# Patient Record
Sex: Male | Born: 1955 | Race: White | Hispanic: No | Marital: Married | Smoking: Never smoker
Health system: Southern US, Community
[De-identification: ages and names within clinical notes are randomized; demographics above are authoritative.]

## PROBLEM LIST (undated history)

## (undated) DIAGNOSIS — I10 Essential (primary) hypertension: Secondary | ICD-10-CM

## (undated) DIAGNOSIS — C801 Malignant (primary) neoplasm, unspecified: Secondary | ICD-10-CM

## (undated) DIAGNOSIS — Z860100 Personal history of colon polyps, unspecified: Secondary | ICD-10-CM

## (undated) DIAGNOSIS — L80 Vitiligo: Secondary | ICD-10-CM

## (undated) DIAGNOSIS — R413 Other amnesia: Principal | ICD-10-CM

## (undated) DIAGNOSIS — E785 Hyperlipidemia, unspecified: Secondary | ICD-10-CM

## (undated) HISTORY — DX: Other amnesia: R41.3

## (undated) HISTORY — PX: ORCHIECTOMY: SHX2116

## (undated) HISTORY — DX: Personal history of colonic polyps: Z86.010

## (undated) HISTORY — DX: Malignant (primary) neoplasm, unspecified: C80.1

## (undated) HISTORY — PX: BICEPS TENDON REPAIR: SHX566

## (undated) HISTORY — DX: Essential (primary) hypertension: I10

## (undated) HISTORY — PX: UMBILICAL HERNIA REPAIR: SHX196

## (undated) HISTORY — DX: Personal history of colon polyps, unspecified: Z86.0100

## (undated) HISTORY — DX: Vitiligo: L80

## (undated) HISTORY — PX: TONSILLECTOMY: SUR1361

## (undated) HISTORY — DX: Hyperlipidemia, unspecified: E78.5

---

## 2000-05-11 ENCOUNTER — Encounter: Payer: Self-pay | Admitting: Family Medicine

## 2000-05-11 ENCOUNTER — Ambulatory Visit: Admission: RE | Admit: 2000-05-11 | Discharge: 2000-05-11 | Payer: Self-pay | Admitting: Family Medicine

## 2000-05-12 ENCOUNTER — Ambulatory Visit (HOSPITAL_COMMUNITY): Admission: RE | Admit: 2000-05-12 | Discharge: 2000-05-12 | Payer: Self-pay | Admitting: Urology

## 2000-05-12 ENCOUNTER — Encounter (INDEPENDENT_AMBULATORY_CARE_PROVIDER_SITE_OTHER): Payer: Self-pay

## 2000-05-14 ENCOUNTER — Encounter: Payer: Self-pay | Admitting: Urology

## 2000-05-14 ENCOUNTER — Encounter: Admission: RE | Admit: 2000-05-14 | Discharge: 2000-05-14 | Payer: Self-pay | Admitting: Urology

## 2000-06-12 ENCOUNTER — Ambulatory Visit (HOSPITAL_COMMUNITY): Admission: RE | Admit: 2000-06-12 | Discharge: 2000-06-12 | Payer: Self-pay | Admitting: Hematology & Oncology

## 2000-06-13 ENCOUNTER — Encounter: Payer: Self-pay | Admitting: Hematology & Oncology

## 2000-07-04 ENCOUNTER — Encounter: Payer: Self-pay | Admitting: Hematology & Oncology

## 2000-07-04 ENCOUNTER — Ambulatory Visit (HOSPITAL_COMMUNITY): Admission: RE | Admit: 2000-07-04 | Discharge: 2000-07-04 | Payer: Self-pay | Admitting: Hematology & Oncology

## 2000-07-08 ENCOUNTER — Ambulatory Visit (HOSPITAL_COMMUNITY): Admission: RE | Admit: 2000-07-08 | Discharge: 2000-07-08 | Payer: Self-pay | Admitting: Hematology & Oncology

## 2000-07-08 ENCOUNTER — Encounter: Payer: Self-pay | Admitting: Hematology & Oncology

## 2000-07-25 ENCOUNTER — Ambulatory Visit (HOSPITAL_COMMUNITY): Admission: RE | Admit: 2000-07-25 | Discharge: 2000-07-25 | Payer: Self-pay | Admitting: General Surgery

## 2000-07-28 ENCOUNTER — Encounter: Payer: Self-pay | Admitting: Hematology & Oncology

## 2000-07-28 ENCOUNTER — Encounter (HOSPITAL_COMMUNITY): Admission: RE | Admit: 2000-07-28 | Discharge: 2000-10-26 | Payer: Self-pay | Admitting: Hematology & Oncology

## 2000-08-05 ENCOUNTER — Encounter: Payer: Self-pay | Admitting: Hematology & Oncology

## 2000-08-05 ENCOUNTER — Ambulatory Visit (HOSPITAL_COMMUNITY): Admission: RE | Admit: 2000-08-05 | Discharge: 2000-08-05 | Payer: Self-pay | Admitting: Hematology & Oncology

## 2000-08-07 ENCOUNTER — Ambulatory Visit (HOSPITAL_COMMUNITY): Admission: RE | Admit: 2000-08-07 | Discharge: 2000-08-07 | Payer: Self-pay | Admitting: Hematology & Oncology

## 2000-08-07 ENCOUNTER — Encounter: Payer: Self-pay | Admitting: Hematology & Oncology

## 2000-08-18 ENCOUNTER — Encounter: Admission: RE | Admit: 2000-08-18 | Discharge: 2000-08-18 | Payer: Self-pay | Admitting: Hematology & Oncology

## 2000-08-18 ENCOUNTER — Encounter: Payer: Self-pay | Admitting: Hematology & Oncology

## 2000-09-05 ENCOUNTER — Encounter: Payer: Self-pay | Admitting: Hematology & Oncology

## 2000-09-05 ENCOUNTER — Ambulatory Visit (HOSPITAL_COMMUNITY): Admission: RE | Admit: 2000-09-05 | Discharge: 2000-09-05 | Payer: Self-pay | Admitting: Hematology & Oncology

## 2000-09-17 ENCOUNTER — Encounter (INDEPENDENT_AMBULATORY_CARE_PROVIDER_SITE_OTHER): Payer: Self-pay | Admitting: Specialist

## 2000-09-17 ENCOUNTER — Ambulatory Visit (HOSPITAL_COMMUNITY): Admission: RE | Admit: 2000-09-17 | Discharge: 2000-09-17 | Payer: Self-pay | Admitting: Gastroenterology

## 2004-09-28 ENCOUNTER — Ambulatory Visit: Payer: Self-pay | Admitting: Hematology & Oncology

## 2004-10-04 ENCOUNTER — Ambulatory Visit: Payer: Self-pay | Admitting: Family Medicine

## 2004-10-09 ENCOUNTER — Ambulatory Visit: Payer: Self-pay | Admitting: Family Medicine

## 2004-10-30 ENCOUNTER — Ambulatory Visit: Payer: Self-pay | Admitting: Family Medicine

## 2004-12-31 ENCOUNTER — Ambulatory Visit: Payer: Self-pay | Admitting: Family Medicine

## 2005-01-07 ENCOUNTER — Ambulatory Visit: Payer: Self-pay | Admitting: Family Medicine

## 2005-04-04 ENCOUNTER — Ambulatory Visit: Payer: Self-pay | Admitting: Hematology & Oncology

## 2005-08-16 ENCOUNTER — Ambulatory Visit: Payer: Self-pay | Admitting: Family Medicine

## 2005-09-18 ENCOUNTER — Ambulatory Visit: Payer: Self-pay | Admitting: Family Medicine

## 2005-10-03 ENCOUNTER — Ambulatory Visit: Payer: Self-pay | Admitting: Hematology & Oncology

## 2005-10-29 ENCOUNTER — Ambulatory Visit: Payer: Self-pay | Admitting: Family Medicine

## 2005-10-31 ENCOUNTER — Ambulatory Visit: Payer: Self-pay | Admitting: Internal Medicine

## 2005-11-21 ENCOUNTER — Ambulatory Visit: Payer: Self-pay | Admitting: Family Medicine

## 2006-01-27 ENCOUNTER — Ambulatory Visit: Payer: Self-pay | Admitting: Family Medicine

## 2006-02-27 ENCOUNTER — Ambulatory Visit: Payer: Self-pay | Admitting: Family Medicine

## 2006-03-28 ENCOUNTER — Ambulatory Visit: Payer: Self-pay | Admitting: Hematology & Oncology

## 2006-03-28 ENCOUNTER — Ambulatory Visit: Payer: Self-pay | Admitting: Family Medicine

## 2006-04-01 LAB — CBC WITH DIFFERENTIAL/PLATELET
Basophils Absolute: 0.1 10*3/uL (ref 0.0–0.1)
Eosinophils Absolute: 0.1 10*3/uL (ref 0.0–0.5)
HGB: 15.1 g/dL (ref 13.0–17.1)
LYMPH%: 24.7 % (ref 14.0–48.0)
MCV: 88.9 fL (ref 81.6–98.0)
MONO%: 6.7 % (ref 0.0–13.0)
NEUT#: 6.9 10*3/uL — ABNORMAL HIGH (ref 1.5–6.5)
NEUT%: 66.9 % (ref 40.0–75.0)
Platelets: 288 10*3/uL (ref 145–400)

## 2006-04-03 LAB — COMPREHENSIVE METABOLIC PANEL WITH GFR
ALT: 34 U/L (ref 0–40)
AST: 20 U/L (ref 0–37)
Albumin: 4 g/dL (ref 3.5–5.2)
Alkaline Phosphatase: 29 U/L — ABNORMAL LOW (ref 39–117)
BUN: 19 mg/dL (ref 6–23)
CO2: 28 meq/L (ref 19–32)
Calcium: 9.3 mg/dL (ref 8.4–10.5)
Chloride: 103 meq/L (ref 96–112)
Creatinine, Ser: 1 mg/dL (ref 0.4–1.5)
Glucose, Bld: 104 mg/dL — ABNORMAL HIGH (ref 70–99)
Potassium: 3.3 meq/L — ABNORMAL LOW (ref 3.5–5.3)
Sodium: 140 meq/L (ref 135–145)
Total Bilirubin: 0.6 mg/dL (ref 0.3–1.2)
Total Protein: 6.6 g/dL (ref 6.0–8.3)

## 2006-04-03 LAB — LACTATE DEHYDROGENASE: LDH: 153 U/L (ref 94–250)

## 2006-04-03 LAB — BETA HCG QUANT (REF LAB): Beta hCG, Tumor Marker: <1 IU/L (ref 0–3)

## 2006-06-16 ENCOUNTER — Ambulatory Visit: Payer: Self-pay | Admitting: Family Medicine

## 2006-07-14 ENCOUNTER — Encounter: Admission: RE | Admit: 2006-07-14 | Discharge: 2006-07-14 | Payer: Self-pay | Admitting: Family Medicine

## 2006-09-25 ENCOUNTER — Ambulatory Visit: Payer: Self-pay | Admitting: Hematology & Oncology

## 2006-09-29 LAB — CBC WITH DIFFERENTIAL/PLATELET
BASO%: 0.3 % (ref 0.0–2.0)
EOS%: 4.5 % (ref 0.0–7.0)
Eosinophils Absolute: 0.3 10*3/uL (ref 0.0–0.5)
HCT: 44.6 % (ref 38.7–49.9)
HGB: 15.6 g/dL (ref 13.0–17.1)
MCH: 31.3 pg (ref 28.0–33.4)
MCHC: 35 g/dL (ref 32.0–35.9)
NEUT%: 61.3 % (ref 40.0–75.0)
RBC: 4.99 10*6/uL (ref 4.20–5.71)
RDW: 13.8 % (ref 11.2–14.6)
WBC: 6.9 10*3/uL (ref 4.0–10.0)
lymph#: 1.7 10*3/uL (ref 0.9–3.3)

## 2006-10-01 LAB — COMPREHENSIVE METABOLIC PANEL
ALT: 51 U/L (ref 0–53)
AST: 25 U/L (ref 0–37)
Calcium: 9.4 mg/dL (ref 8.4–10.5)
Chloride: 101 mEq/L (ref 96–112)
Creatinine, Ser: 1.12 mg/dL (ref 0.40–1.50)
Sodium: 139 mEq/L (ref 135–145)
Total Bilirubin: 0.7 mg/dL (ref 0.3–1.2)
Total Protein: 6.8 g/dL (ref 6.0–8.3)

## 2007-02-05 ENCOUNTER — Ambulatory Visit: Payer: Self-pay | Admitting: Family Medicine

## 2007-08-19 ENCOUNTER — Ambulatory Visit: Payer: Self-pay | Admitting: Internal Medicine

## 2007-10-04 ENCOUNTER — Ambulatory Visit: Payer: Self-pay | Admitting: Hematology & Oncology

## 2007-10-07 LAB — CBC WITH DIFFERENTIAL/PLATELET
BASO%: 0.3 % (ref 0.0–2.0)
HCT: 44.6 % (ref 38.7–49.9)
LYMPH%: 21.5 % (ref 14.0–48.0)
MCH: 31.9 pg (ref 28.0–33.4)
MCHC: 36.1 g/dL — ABNORMAL HIGH (ref 32.0–35.9)
MCV: 88.3 fL (ref 81.6–98.0)
MONO#: 0.5 10*3/uL (ref 0.1–0.9)
MONO%: 7 % (ref 0.0–13.0)
NEUT%: 67.5 % (ref 40.0–75.0)
Platelets: 218 10*3/uL (ref 145–400)
RBC: 5.05 10*6/uL (ref 4.20–5.71)
WBC: 7.5 10*3/uL (ref 4.0–10.0)

## 2007-10-09 LAB — COMPREHENSIVE METABOLIC PANEL
ALT: 37 U/L (ref 0–53)
Alkaline Phosphatase: 33 U/L — ABNORMAL LOW (ref 39–117)
CO2: 25 mEq/L (ref 19–32)
Creatinine, Ser: 1.11 mg/dL (ref 0.40–1.50)
Total Bilirubin: 0.5 mg/dL (ref 0.3–1.2)

## 2007-10-09 LAB — LACTATE DEHYDROGENASE: LDH: 173 U/L (ref 94–250)

## 2007-10-09 LAB — AFP TUMOR MARKER: AFP-Tumor Marker: 27.2 ng/mL — ABNORMAL HIGH (ref 0.0–8.0)

## 2007-10-19 ENCOUNTER — Ambulatory Visit: Payer: Self-pay | Admitting: Internal Medicine

## 2007-10-22 ENCOUNTER — Ambulatory Visit: Payer: Self-pay | Admitting: Internal Medicine

## 2007-10-22 DIAGNOSIS — E785 Hyperlipidemia, unspecified: Secondary | ICD-10-CM | POA: Insufficient documentation

## 2007-10-22 DIAGNOSIS — R7309 Other abnormal glucose: Secondary | ICD-10-CM

## 2007-10-22 DIAGNOSIS — I10 Essential (primary) hypertension: Secondary | ICD-10-CM

## 2007-10-22 DIAGNOSIS — Z860100 Personal history of colon polyps, unspecified: Secondary | ICD-10-CM | POA: Insufficient documentation

## 2007-10-22 DIAGNOSIS — C6290 Malignant neoplasm of unspecified testis, unspecified whether descended or undescended: Secondary | ICD-10-CM

## 2007-10-23 LAB — CONVERTED CEMR LAB
Basophils Relative: 0.7 % (ref 0.0–1.0)
Bilirubin, Direct: 0.1 mg/dL (ref 0.0–0.3)
CO2: 31 meq/L (ref 19–32)
Eosinophils Absolute: 0.2 10*3/uL (ref 0.0–0.6)
Eosinophils Relative: 2.7 % (ref 0.0–5.0)
GFR calc Af Amer: 91 mL/min
GFR calc non Af Amer: 75 mL/min
Glucose, Bld: 115 mg/dL — ABNORMAL HIGH (ref 70–99)
HCT: 45.1 % (ref 39.0–52.0)
Hemoglobin: 15.8 g/dL (ref 13.0–17.0)
Leukocytes, UA: NEGATIVE
Lymphocytes Relative: 21.4 % (ref 12.0–46.0)
MCV: 89.7 fL (ref 78.0–100.0)
Monocytes Absolute: 0.7 10*3/uL (ref 0.2–0.7)
Neutro Abs: 4.9 10*3/uL (ref 1.4–7.7)
Neutrophils Relative %: 66.4 % (ref 43.0–77.0)
Nitrite: NEGATIVE
PSA: 0.57 ng/mL (ref 0.10–4.00)
Sodium: 142 meq/L (ref 135–145)
TSH: 1.57 microintl units/mL (ref 0.35–5.50)
Total Protein: 7 g/dL (ref 6.0–8.3)
Triglycerides: 352 mg/dL (ref 0–149)
Urine Glucose: NEGATIVE mg/dL
WBC: 7.5 10*3/uL (ref 4.5–10.5)

## 2007-10-28 ENCOUNTER — Encounter: Payer: Self-pay | Admitting: Internal Medicine

## 2007-10-28 LAB — CONVERTED CEMR LAB: Vitamin B-12: 355 pg/mL (ref 211–911)

## 2007-11-03 ENCOUNTER — Telehealth: Payer: Self-pay | Admitting: Internal Medicine

## 2007-12-04 ENCOUNTER — Telehealth: Payer: Self-pay | Admitting: Internal Medicine

## 2007-12-07 ENCOUNTER — Telehealth (INDEPENDENT_AMBULATORY_CARE_PROVIDER_SITE_OTHER): Payer: Self-pay | Admitting: *Deleted

## 2007-12-22 ENCOUNTER — Ambulatory Visit: Payer: Self-pay | Admitting: Internal Medicine

## 2007-12-22 LAB — CONVERTED CEMR LAB
AST: 27 units/L (ref 0–37)
CRP, High Sensitivity: 3 (ref 0.00–5.00)
Cholesterol: 182 mg/dL (ref 0–200)
Total CHOL/HDL Ratio: 5.4
Triglycerides: 320 mg/dL (ref 0–149)
VLDL: 64 mg/dL — ABNORMAL HIGH (ref 0–40)

## 2007-12-25 ENCOUNTER — Ambulatory Visit: Payer: Self-pay | Admitting: Internal Medicine

## 2007-12-25 ENCOUNTER — Telehealth (INDEPENDENT_AMBULATORY_CARE_PROVIDER_SITE_OTHER): Payer: Self-pay | Admitting: *Deleted

## 2008-05-05 ENCOUNTER — Encounter: Payer: Self-pay | Admitting: Internal Medicine

## 2008-06-22 ENCOUNTER — Ambulatory Visit: Payer: Self-pay | Admitting: Family Medicine

## 2008-06-22 ENCOUNTER — Emergency Department (HOSPITAL_COMMUNITY): Admission: EM | Admit: 2008-06-22 | Discharge: 2008-06-22 | Payer: Self-pay | Admitting: Emergency Medicine

## 2008-09-27 ENCOUNTER — Ambulatory Visit: Payer: Self-pay | Admitting: Hematology & Oncology

## 2008-09-28 LAB — CBC WITH DIFFERENTIAL (CANCER CENTER ONLY)
BASO#: 0.1 10*3/uL (ref 0.0–0.2)
EOS%: 4 % (ref 0.0–7.0)
HCT: 45 % (ref 38.7–49.9)
HGB: 15.8 g/dL (ref 13.0–17.1)
LYMPH#: 2.1 10*3/uL (ref 0.9–3.3)
MCH: 31.5 pg (ref 28.0–33.4)
MCHC: 35.2 g/dL (ref 32.0–35.9)
NEUT%: 59.8 % (ref 40.0–80.0)

## 2008-09-29 LAB — TESTOSTERONE: Testosterone: 227.88 ng/dL — ABNORMAL LOW (ref 350–890)

## 2008-10-02 LAB — COMPREHENSIVE METABOLIC PANEL
ALT: 21 U/L (ref 0–53)
BUN: 16 mg/dL (ref 6–23)
CO2: 29 mEq/L (ref 19–32)
Calcium: 9.8 mg/dL (ref 8.4–10.5)
Chloride: 100 mEq/L (ref 96–112)
Creatinine, Ser: 1.2 mg/dL (ref 0.40–1.50)
Glucose, Bld: 88 mg/dL (ref 70–99)
Total Bilirubin: 0.5 mg/dL (ref 0.3–1.2)

## 2008-10-02 LAB — BETA HCG QUANT (REF LAB): Beta hCG, Tumor Marker: 0.5 m[IU]/mL (ref <5.0)

## 2008-10-02 LAB — PSA: PSA: 0.59 ng/mL (ref 0.10–4.00)

## 2008-10-02 LAB — AFP TUMOR MARKER: AFP-Tumor Marker: 26.9 ng/mL — ABNORMAL HIGH (ref 0.0–8.0)

## 2008-11-24 ENCOUNTER — Telehealth: Payer: Self-pay | Admitting: *Deleted

## 2008-11-25 ENCOUNTER — Telehealth: Payer: Self-pay | Admitting: *Deleted

## 2008-12-13 ENCOUNTER — Ambulatory Visit: Payer: Self-pay | Admitting: Family Medicine

## 2008-12-13 LAB — CONVERTED CEMR LAB
AST: 25 units/L (ref 0–37)
Albumin: 3.8 g/dL (ref 3.5–5.2)
Alkaline Phosphatase: 28 units/L — ABNORMAL LOW (ref 39–117)
BUN: 17 mg/dL (ref 6–23)
Bilirubin Urine: NEGATIVE
Bilirubin, Direct: 0.1 mg/dL (ref 0.0–0.3)
CO2: 27 meq/L (ref 19–32)
Chloride: 102 meq/L (ref 96–112)
Eosinophils Relative: 4.6 % (ref 0.0–5.0)
GFR calc Af Amer: 114 mL/min
Glucose, Bld: 108 mg/dL — ABNORMAL HIGH (ref 70–99)
HCT: 43.8 % (ref 39.0–52.0)
HDL: 36 mg/dL — ABNORMAL LOW (ref >39.0)
Hemoglobin, Urine: NEGATIVE
Leukocytes, UA: NEGATIVE
Lymphocytes Relative: 21.9 % (ref 12.0–46.0)
Monocytes Absolute: 0.6 10*3/uL (ref 0.1–1.0)
Monocytes Relative: 9 % (ref 3.0–12.0)
Neutrophils Relative %: 63.9 % (ref 43.0–77.0)
Nitrite: NEGATIVE
Platelets: 176 10*3/uL (ref 150–400)
Potassium: 3.1 meq/L — ABNORMAL LOW (ref 3.5–5.1)
RDW: 12.5 % (ref 11.5–14.6)
Sodium: 138 meq/L (ref 135–145)
Testosterone: 261.1 ng/dL — ABNORMAL LOW (ref 350.00–890)
Total CHOL/HDL Ratio: 4.9
Total Protein, Urine: NEGATIVE mg/dL
Total Protein: 6.6 g/dL (ref 6.0–8.3)
Triglycerides: 270 mg/dL (ref 0–149)
WBC: 6.8 10*3/uL (ref 4.5–10.5)

## 2008-12-20 ENCOUNTER — Ambulatory Visit: Payer: Self-pay | Admitting: Family Medicine

## 2008-12-20 DIAGNOSIS — E349 Endocrine disorder, unspecified: Secondary | ICD-10-CM

## 2008-12-20 DIAGNOSIS — L80 Vitiligo: Secondary | ICD-10-CM

## 2008-12-22 ENCOUNTER — Telehealth: Payer: Self-pay | Admitting: Family Medicine

## 2009-01-05 ENCOUNTER — Ambulatory Visit: Payer: Self-pay | Admitting: Family Medicine

## 2009-03-24 ENCOUNTER — Ambulatory Visit (HOSPITAL_BASED_OUTPATIENT_CLINIC_OR_DEPARTMENT_OTHER): Admission: RE | Admit: 2009-03-24 | Discharge: 2009-03-24 | Payer: Self-pay | Admitting: General Surgery

## 2009-04-06 ENCOUNTER — Ambulatory Visit: Payer: Self-pay | Admitting: Family Medicine

## 2009-04-13 LAB — CONVERTED CEMR LAB: Testosterone: 382.15 ng/dL (ref 350.00–890.00)

## 2009-08-17 ENCOUNTER — Telehealth: Payer: Self-pay | Admitting: Family Medicine

## 2009-09-26 ENCOUNTER — Ambulatory Visit: Payer: Self-pay | Admitting: Hematology & Oncology

## 2009-09-27 LAB — CBC WITH DIFFERENTIAL (CANCER CENTER ONLY)
Eosinophils Absolute: 0.3 10*3/uL (ref 0.0–0.5)
HGB: 15.8 g/dL (ref 13.0–17.1)
MCV: 88 fL (ref 82–98)
MONO#: 0.4 10*3/uL (ref 0.1–0.9)
NEUT#: 5.8 10*3/uL (ref 1.5–6.5)
Platelets: 180 10*3/uL (ref 145–400)
RBC: 5.12 10*6/uL (ref 4.20–5.70)
WBC: 8.2 10*3/uL (ref 4.0–10.0)

## 2009-09-28 LAB — LIPID PANEL
Cholesterol: 202 mg/dL — ABNORMAL HIGH (ref 0–200)
HDL: 45 mg/dL (ref >39)
Total CHOL/HDL Ratio: 4.5 Ratio
Triglycerides: 420 mg/dL — ABNORMAL HIGH (ref <150)

## 2009-09-28 LAB — TESTOSTERONE: Testosterone: 174.27 ng/dL — ABNORMAL LOW (ref 350–890)

## 2009-09-29 LAB — BETA HCG QUANT (REF LAB): Beta hCG, Tumor Marker: 2.8 m[IU]/mL (ref <5.0)

## 2009-09-29 LAB — COMPREHENSIVE METABOLIC PANEL
ALT: 21 U/L (ref 0–53)
AST: 19 U/L (ref 0–37)
CO2: 27 mEq/L (ref 19–32)
Chloride: 101 mEq/L (ref 96–112)
Creatinine, Ser: 1.11 mg/dL (ref 0.40–1.50)
Sodium: 139 mEq/L (ref 135–145)
Total Bilirubin: 0.4 mg/dL (ref 0.3–1.2)
Total Protein: 7.2 g/dL (ref 6.0–8.3)

## 2009-09-29 LAB — PSA: PSA: 0.44 ng/mL (ref 0.10–4.00)

## 2009-09-29 LAB — LACTATE DEHYDROGENASE: LDH: 144 U/L (ref 94–250)

## 2009-09-29 LAB — AFP TUMOR MARKER: AFP-Tumor Marker: 27.8 ng/mL — ABNORMAL HIGH (ref 0.0–8.0)

## 2009-10-11 ENCOUNTER — Telehealth: Payer: Self-pay | Admitting: Family Medicine

## 2009-10-20 ENCOUNTER — Telehealth: Payer: Self-pay | Admitting: Family Medicine

## 2009-10-23 ENCOUNTER — Telehealth: Payer: Self-pay | Admitting: Family Medicine

## 2009-12-18 ENCOUNTER — Telehealth (INDEPENDENT_AMBULATORY_CARE_PROVIDER_SITE_OTHER): Payer: Self-pay | Admitting: *Deleted

## 2009-12-18 ENCOUNTER — Ambulatory Visit: Payer: Self-pay | Admitting: Family Medicine

## 2009-12-18 LAB — CONVERTED CEMR LAB
AST: 30 units/L (ref 0–37)
Albumin: 4 g/dL (ref 3.5–5.2)
Alkaline Phosphatase: 33 units/L — ABNORMAL LOW (ref 39–117)
Basophils Relative: 0.7 % (ref 0.0–3.0)
CO2: 29 meq/L (ref 19–32)
Calcium: 9.3 mg/dL (ref 8.4–10.5)
Direct LDL: 93.5 mg/dL
GFR calc non Af Amer: 74.63 mL/min (ref >60)
HCT: 47.6 % (ref 39.0–52.0)
Hemoglobin: 16.1 g/dL (ref 13.0–17.0)
Lymphocytes Relative: 20.4 % (ref 12.0–46.0)
Lymphs Abs: 1.8 10*3/uL (ref 0.7–4.0)
MCHC: 33.7 g/dL (ref 30.0–36.0)
Monocytes Relative: 9 % (ref 3.0–12.0)
Neutro Abs: 5.4 10*3/uL (ref 1.4–7.7)
Nitrite: NEGATIVE
PSA: 0.65 ng/mL (ref 0.10–4.00)
Potassium: 3.3 meq/L — ABNORMAL LOW (ref 3.5–5.1)
RBC: 5.15 M/uL (ref 4.22–5.81)
Sodium: 141 meq/L (ref 135–145)
Specific Gravity, Urine: 1.025 (ref 1.000–1.030)
Total Protein, Urine: NEGATIVE mg/dL
Total Protein: 7 g/dL (ref 6.0–8.3)
Vit D, 25-Hydroxy: 41 ng/mL (ref 30–89)
pH: 6 (ref 5.0–8.0)

## 2009-12-25 ENCOUNTER — Ambulatory Visit: Payer: Self-pay | Admitting: Family Medicine

## 2010-02-06 ENCOUNTER — Ambulatory Visit: Payer: Self-pay | Admitting: Family Medicine

## 2010-02-06 DIAGNOSIS — J301 Allergic rhinitis due to pollen: Secondary | ICD-10-CM

## 2010-09-21 ENCOUNTER — Ambulatory Visit: Payer: Self-pay | Admitting: Hematology & Oncology

## 2010-09-26 ENCOUNTER — Encounter: Payer: Self-pay | Admitting: Family Medicine

## 2010-09-26 LAB — CBC WITH DIFFERENTIAL (CANCER CENTER ONLY)
BASO#: 0.1 10e3/uL (ref 0.0–0.2)
BASO%: 1.5 % (ref 0.0–2.0)
EOS%: 4.6 % (ref 0.0–7.0)
Eosinophils Absolute: 0.3 10e3/uL (ref 0.0–0.5)
HCT: 48.5 % (ref 38.7–49.9)
HGB: 16.5 g/dL (ref 13.0–17.1)
LYMPH#: 1.5 10e3/uL (ref 0.9–3.3)
LYMPH%: 20.3 % (ref 14.0–48.0)
MCH: 30.4 pg (ref 28.0–33.4)
MCHC: 33.9 g/dL (ref 32.0–35.9)
MCV: 89 fL (ref 82–98)
MONO#: 0.7 10e3/uL (ref 0.1–0.9)
MONO%: 9.7 % (ref 0.0–13.0)
NEUT#: 4.8 10e3/uL (ref 1.5–6.5)
NEUT%: 63.9 % (ref 40.0–80.0)
Platelets: 225 10e3/uL (ref 145–400)
RBC: 5.42 10e6/uL (ref 4.20–5.70)
RDW: 12.1 % (ref 10.5–14.6)
WBC: 7.5 10e3/uL (ref 4.0–10.0)

## 2010-10-02 LAB — COMPREHENSIVE METABOLIC PANEL
ALT: 44 U/L (ref 0–53)
Alkaline Phosphatase: 33 U/L — ABNORMAL LOW (ref 39–117)
CO2: 26 mEq/L (ref 19–32)
Sodium: 139 mEq/L (ref 135–145)
Total Bilirubin: 0.4 mg/dL (ref 0.3–1.2)
Total Protein: 7 g/dL (ref 6.0–8.3)

## 2010-10-02 LAB — VITAMIN D 25 HYDROXY (VIT D DEFICIENCY, FRACTURES): Vit D, 25-Hydroxy: 32 ng/mL (ref 30–89)

## 2010-10-02 LAB — LIPID PANEL
HDL: 33 mg/dL — ABNORMAL LOW (ref >39)
Total CHOL/HDL Ratio: 5.5 Ratio

## 2010-10-02 LAB — TESTOSTERONE, FREE & TOTAL: Testosterone, total: 387 ng/dL (ref 250–1100)

## 2010-10-15 ENCOUNTER — Telehealth: Payer: Self-pay | Admitting: Family Medicine

## 2010-10-25 ENCOUNTER — Ambulatory Visit: Payer: Self-pay | Admitting: Internal Medicine

## 2010-11-26 ENCOUNTER — Other Ambulatory Visit: Payer: Self-pay | Admitting: Family Medicine

## 2010-11-26 ENCOUNTER — Ambulatory Visit: Admission: RE | Admit: 2010-11-26 | Discharge: 2010-11-26 | Payer: Self-pay | Source: Home / Self Care

## 2010-11-26 LAB — HEPATIC FUNCTION PANEL
ALT: 47 U/L (ref 0–53)
AST: 35 U/L (ref 0–37)
Albumin: 3.7 g/dL (ref 3.5–5.2)
Alkaline Phosphatase: 30 U/L — ABNORMAL LOW (ref 39–117)
Bilirubin, Direct: 0.1 mg/dL (ref 0.0–0.3)
Total Bilirubin: 0.8 mg/dL (ref 0.3–1.2)
Total Protein: 6.6 g/dL (ref 6.0–8.3)

## 2010-11-26 LAB — LDL CHOLESTEROL, DIRECT: Direct LDL: 96.5 mg/dL

## 2010-11-26 LAB — LIPID PANEL
Cholesterol: 175 mg/dL (ref 0–200)
HDL: 34.9 mg/dL — ABNORMAL LOW (ref >39.00)
Total CHOL/HDL Ratio: 5
Triglycerides: 359 mg/dL — ABNORMAL HIGH (ref 0.0–149.0)
VLDL: 71.8 mg/dL — ABNORMAL HIGH (ref 0.0–40.0)

## 2010-11-26 LAB — HEMOGLOBIN A1C: Hgb A1c MFr Bld: 6.3 % (ref 4.6–6.5)

## 2010-11-29 ENCOUNTER — Ambulatory Visit: Admission: RE | Admit: 2010-11-29 | Discharge: 2010-11-29 | Payer: Self-pay | Source: Home / Self Care

## 2010-12-18 NOTE — Progress Notes (Signed)
Summary: REFILL  Phone Note Call from Patient Call back at Home Phone [phone]   Caller: PT LIVE Call For: tODD Summary of Call: HAS HIS CPX SET FOR 2-1 NEEDS REFILL ON ATNOLOL -CHLORTHA 50/25 TBMYL TAKE 1 PER DAY [phone] CELL [phone] Initial call taken by: Roselle Locus,  November 25, 2008 8:42 AM      Prescriptions: TENORETIC 50 50-25 MG  TABS (ATENOLOL-CHLORTHALIDONE) 1 once daily  #60 x 0   Entered by:   Kern Reap CMA   Authorized by:   Roderick Pee MD   Signed by:   Kern Reap CMA on 11/25/2008   Method used:   [facility]        [facility]  [place] [address] [facility]       [address]       [place], [place]  [address]       Ph: [phone] or [phone]       Fax: [phone]   RxID:   [id number]

## 2010-12-18 NOTE — Progress Notes (Signed)
Summary: vitamin d level  Phone Note Call from Patient Call back at Home Phone [phone]   Caller: Spouse Call For: fry Summary of Call: pts wife came in to see Dr Clent Ridges on Wednesday and she said that Dr Tawanna Cooler needed to look at pt's Vitamin D level.  Pt had labs done at Dr Gustavo Lah office and the vit D was low.  pt was wondering if a rx should be called in.  please advise Initial call taken by: Alfred Levins, CMA,  October 11, 2009 4:43 PM  Follow-up for Phone Call        the physician who orders the test is the one who will prescribe the treatment........... do not call wife.......... call husband directly Follow-up by: Roderick Pee MD,  October 16, 2009 8:44 AM  Additional Follow-up for Phone Call Additional follow up Details #1::        left message on machine at work number for patient to return our call Additional Follow-up by: Kern Reap CMA Duncan Dull),  October 16, 2009 5:49 PM    Additional Follow-up for Phone Call Additional follow up Details #2::    Pt's wife calls requesting all lab results be given to her.  Advised her that we will keep attempting to call her husband directly. [phone] Follow-up by: Lynann Beaver CMA,  October 20, 2009 11:06 AM  Additional Follow-up for Phone Call Additional follow up Details #3:: Details for Additional Follow-up Action Taken: left message on machine at work for patient to return our call. Additional Follow-up by: Kern Reap CMA (AAMA),  October 20, 2009 12:09 PM

## 2010-12-18 NOTE — Assessment & Plan Note (Signed)
Summary: new to lipid/dyslipidemia/jml   Visit Type:  Initial Consult   History of Present Illness:       This is a 55 year old male who presents for evaluation in Lipid Clinic.  The patient presents with history of hyperlipidemia with his most recetn lab tests showing TG>600.  He does not remember these being this elevated in the past, but after reviewing records, saw his TG were in the 400s last year.  He does not have a history of diabetes but has several fasting glucose levels >110.  Most recent was 119.  A1c was done 2 years ago and was 5.8%.  Pertinent family history includes history of hyperlipidemia with parent and siblings.   He is currently taking Crestor 10mg  daily for his cholesterol.  He started taking fish oil 2 gm after the last blood work was done.       When asked about diet, pt admitted that he was a "meat and potatoes" person and does not like fruits or vegetables.  He does not eat breakfast every day, but when he does it may be a bagel with cream cheese, instant oatmeal with splenda, or eggs and waffles on the weekend.  He does carry his lunch to work.  He usually packs a cold cut sandwich on whole wheat bread, apples, and cheese and crackers.  Dinner is his larger meal with steak, chicken, or pasta and occassionally fish.  The only vegetables he likes to eat are corn and carrots.  He tries to limit desserts as much as possible.  For snacks he likes cheese-its, nuts, and potato chips.  He does report drinking  ~2 beers/day.        Exercise habits reveals that the patient is not exercising on a routine basis.  [facility] and [facility] 30lbs.  He has a high stress job in IT and states he has just gotten out of the habit of going.    Lipid Management Provider  Weston Brass, PharmD  Preventive Screening-Counseling & Management  Alcohol-Tobacco     Alcohol drinks/day: 2     Alcohol type: beer     Smoking Status:  never  Current Medications (verified): 1)  Tenoretic 50 50-25 Mg  Tabs (Atenolol-Chlorthalidone) .Marland Kitchen.. 1 Once Daily 2)  Klor-Con M20 20 Meq  Tbcr (Potassium Chloride Crys Cr) .... 3 Qam 3)  Crestor 10 Mg  Tabs (Rosuvastatin Calcium) .... Take 1 Tab At Bedtime 4)  Aspir-Low 81 Mg Tbec (Aspirin) .... Once Daily 5)  Fish Oil 1000 Mg Caps (Omega-3 Fatty Acids) .... Once Daily 6)  Androgel Pump 1 % Gel (Testosterone) .... 2 Pumps Daily As Needed 7)  Fenofibrate 160 Mg Tabs (Fenofibrate) .... Take One Tablet By Mouth Daily With A Meal  Allergies (verified): No Known Drug Allergies  Past History:  Past Medical History: Last updated: 12/25/2007 Hyperlipidemia Hypertension Testicular cancer - followed by Dr. Myna Hidalgo Colonic polyps, hx of Vitiligo  Family History: Last updated: 10/22/2007 Family History High cholesterol Family History Hypertension denies history of premature coronary disease. Denies family history of diabetes and  Social History: Last updated: 12/25/2007 Married with two children ages 51, 60 Occupation: Ecologist  Never Smoked Alcohol use-yes (social)  Social History: Alcohol drinks/day:  2   Vital Signs:  Patient profile:   55 year old male Weight:      252 pounds Pulse rate:  75 / minute BP sitting:   132 / 84  Impression & Recommendations:  Problem # 1:  DYSLIPIDEMIA (ICD-272.4) Pt's cholesterol: TC- 181, TG- 617, HDL-35, and LDL not calculated due to elevated TG.  Pt currently on Crestor 10mg  and recently started fish oil.  Since he is tolerating these medications, will continue at this time.  Will add fenofibrate 160mg  once daily to help lower TG.  Pt instructed to call if any muscle pain or aches occur.  Discussed need to change diet and exercise habits with pt.  Will make small changes at a time in hopes pt will make lifelong changes rather than just change for a few months.  Asked him to decrease alcohol intake and to change to  healthier snacks such as walnuts and almonds.  Will try to start exercising again for 30 minutes 3 days a week.  Will f/u in 1 month.    His updated medication list for this problem includes:    Crestor 10 Mg Tabs (Rosuvastatin calcium) .Marland Kitchen... Take 1 tab at bedtime    Fenofibrate 160 Mg Tabs (Fenofibrate) .Marland Kitchen... Take one tablet by mouth daily with a meal  Problem # 2:  OTHER ABNORMAL GLUCOSE (ICD-790.29) Pt's most recent fasting glucose was 119.  Given elevated TGs and pt's self-reported diet, question if pre-diabetic or diabetic.  Will check A1c prior to next visit to confirm.   Patient Instructions: 1)  Start fenofibrate 160mg  once daily 2)  Continue all other medications 3)  Try to decrease alcohol intake to just on special occassions. 4)  Decrease snacks as much as possible; try to stay with walnuts and almonds and snacks 5)  Exercise goal: 30 minutes at least 3 days per week 6)  Labs: November 26, 2010 in am (fasting) at the [place] office 7)  Lipid Clinic Appt: November 29, 2010 at 8:30am  Prescriptions: FENOFIBRATE 160 MG TABS (FENOFIBRATE) Take one tablet by mouth daily with a meal  #30 x 3   Entered by:   Weston Brass PharmD   Authorized by:   Nathen May, MD, [facility]   Signed by:   Weston Brass PharmD on 10/25/2010   Method used:   [facility]        [facility]  [place] [address] [facility]       [address]       [place], [place]  [address]       Ph: [phone] or [phone]       Fax: [phone]   RxID:   [id number]

## 2010-12-18 NOTE — Progress Notes (Signed)
  VIT D added.

## 2010-12-18 NOTE — Progress Notes (Signed)
Summary: lipid referral  Phone Note Call from Patient   Caller: Spouse Call For: Roderick Pee MD Summary of Call: Pt's wife states that Dr. Rexene Edison was supposed to send over pt's Lipids which were extremely high, and needed to be addressed.  Will refax to Dr. Tawanna Cooler ASAP.  Please call them with instructions. [phone] Initial call taken by: Lynann Beaver CMA AAMA,  October 15, 2010 4:37 PM  Follow-up for Phone Call        wife returning call. she will fax a copy she has.  Follow-up by: Kern Reap CMA Duncan Dull),  October 15, 2010 5:10 PM  Additional Follow-up for Phone Call Additional follow up Details #1::        referred patient to the lipid clinic for an evaluation of elevated triglycerides............. call husband directly Additional Follow-up by: Roderick Pee MD,  October 15, 2010 5:46 PM    Additional Follow-up for Phone Call Additional follow up Details #2::    left message on machine for patient  Follow-up by: Kern Reap CMA Duncan Dull),  October 16, 2010 2:36 PM

## 2010-12-18 NOTE — Assessment & Plan Note (Signed)
Summary: cough/congestion/njr   Vital Signs:  Patient profile:   55 year old male Weight:      247 pounds BMI:     33.16 Temp:     98.4 degrees F oral BP sitting:   110 / 80  (left arm) Cuff size:   regular  Vitals Entered By: Raechel Ache, RN (February 06, 2010 4:46 PM) CC: C/o head congestion, dry cough x 2 weeks.   History of Present Illness: For 2 weeks he has had sinus pressure, PND, ST, HA, and a dry cough. No fever. This seems to get worse everytime he is outside any length of time. No hx of allergies before.   Allergies: No Known Drug Allergies  Past History:  Past Medical History: Reviewed history from 12/25/2007 and no changes required. Hyperlipidemia Hypertension Testicular cancer - followed by Dr. Myna Hidalgo Colonic polyps, hx of Vitiligo  Review of Systems  The patient denies anorexia, fever, weight loss, weight gain, vision loss, decreased hearing, hoarseness, chest pain, syncope, dyspnea on exertion, peripheral edema, hemoptysis, abdominal pain, melena, hematochezia, severe indigestion/heartburn, hematuria, incontinence, genital sores, muscle weakness, suspicious skin lesions, transient blindness, difficulty walking, depression, unusual weight change, abnormal bleeding, enlarged lymph nodes, angioedema, breast masses, and testicular masses.    Physical Exam  General:  Well-developed,well-nourished,in no acute distress; alert,appropriate and cooperative throughout examination Head:  Normocephalic and atraumatic without obvious abnormalities. No apparent alopecia or balding. Eyes:  No corneal or conjunctival inflammation noted. EOMI. Perrla. Funduscopic exam benign, without hemorrhages, exudates or papilledema. Vision grossly normal. Ears:  External ear exam shows no significant lesions or deformities.  Otoscopic examination reveals clear canals, tympanic membranes are intact bilaterally without bulging, retraction, inflammation or discharge. Hearing is grossly  normal bilaterally. Nose:  External nasal examination shows no deformity or inflammation. Nasal mucosa are pink and moist without lesions or exudates. Mouth:  Oral mucosa and oropharynx without lesions or exudates.  Teeth in good repair. Neck:  No deformities, masses, or tenderness noted. Lungs:  Normal respiratory effort, chest expands symmetrically. Lungs are clear to auscultation, no crackles or wheezes.   Impression & Recommendations:  Problem # 1:  ACUTE SINUSITIS, UNSPECIFIED (ICD-461.9)  His updated medication list for this problem includes:    Zithromax Z-pak 250 Mg Tabs (Azithromycin) .Marland Kitchen... As directed  Problem # 2:  ALLERGIC RHINITIS, SEASONAL (ICD-477.0)  Complete Medication List: 1)  Tenoretic 50 50-25 Mg Tabs (Atenolol-chlorthalidone) .Marland Kitchen.. 1 once daily 2)  Klor-con M20 20 Meq Tbcr (Potassium chloride crys cr) .... 3 qam 3)  Crestor 10 Mg Tabs (Rosuvastatin calcium) .... Take 1 tab at bedtime 4)  Aspir-low 81 Mg Tbec (Aspirin) .... Once daily 5)  Fish Oil 1000 Mg Caps (Omega-3 fatty acids) .... Once daily 6)  Rapaflo 4 Mg Caps (Silodosin) .... Once daily 7)  Oxybutynin Chloride 5 Mg Tabs (Oxybutynin chloride) .... Once daily 8)  Androgel Pump 1 % Gel (Testosterone) .... 2 pumps daily 9)  Zithromax Z-pak 250 Mg Tabs (Azithromycin) .... As directed  Patient Instructions: 1)  Start using Claritin and Delsym as needed . Prescriptions: ZITHROMAX Z-PAK 250 MG TABS (AZITHROMYCIN) as directed  #1 x 0   Entered and Authorized by:   Nelwyn Salisbury MD   Signed by:   Nelwyn Salisbury MD on 02/06/2010   Method used:   [facility]        [facility]  [address] [phone][facility]       [address]  [place], [place]  [address]       Ph: [phone] or [phone]       Fax: [phone]   RxID:   [id number]

## 2010-12-18 NOTE — Progress Notes (Signed)
Summary: Crestor  Phone Note Call from Patient   Summary of Call: Pt is req crestor rx, was given samples recently Initial call taken by: Lamar Sprinkles,  December 04, 2007 5:03 PM  Follow-up for Phone Call        ok to call in crestor 10 mg #30 one by mouth once daily refill x 3  Follow-up by: Dondra Spry DO,  December 06, 2007 1:50 PM  Additional Follow-up for Phone Call Additional follow up Details #1::        left mess to call office back with pharm ..................................................................Marland KitchenLamar Sprinkles  December 07, 2007 9:52 AM       Prescriptions: CRESTOR 10 MG  TABS (ROSUVASTATIN CALCIUM) Take 1 tablet by mouth once a day  #30 x 3   Entered by:   Lamar Sprinkles   Authorized by:   Dondra Spry DO   Signed by:   Lamar Sprinkles on 12/07/2007   Method used:   Electronically [facility] to ...       [facility]  [place] [address]       [address]       [place], [place]  [address]       Ph: [phone] or [phone]       Fax: [phone]   RxID:   [id number]

## 2010-12-18 NOTE — Assessment & Plan Note (Signed)
Summary: CPX // RS   Vital Signs:  Patient profile:   55 year old male Height:      72.5 inches Weight:      250 pounds BMI:     33.56 Temp:     98.9 degrees F oral BP sitting:   120 / 84  (left arm) Cuff size:   regular  Vitals Entered By: Kern Reap CMA Duncan Dull) (December 25, 2009 8:27 AM)  Reason for Visit cpx  History of Present Illness: Brent Hansen is a 55 year old, married male, nonsmoker, who comes in today for evaluation of hypertension, hyperlipidemia, fatigue.  His hypertension is treated with Tenoretic 5025 daily 81 20/84.  He takes two, potassium supplements daily.  Despite that his serum potassium is 3.3.  We discussed very as options.  He likes to take one more potassium tablet.  He takes Crestor 10 mg daily.  Lipids are normal.   by his urologist.  It doesn't seem to be working.  He has a good up to 3 times at night.  He is afternoon fatigue because the sleep dysfunction.  Review of systems otherwise negative.  He gets routine eye care and dental care.  Colonoscopy done.  GI tetanus 2010 seasonal flu 2010  He also has a history of low testosterone.  He's not been using the supplement and is requesting a testosterone level.  I explained if he is not using the supplement.  The level below.  I would recommend he use the supplement over an 8 week period of time and then get a testosterone level  Allergies (verified): No Known Drug Allergies  Past History:  Past medical, surgical, family and social histories (including risk factors) reviewed, and no changes noted (except as noted below).  Past Medical History: Reviewed history from 12/25/2007 and no changes required. Hyperlipidemia Hypertension Testicular cancer - followed by Dr. Myna Hidalgo Colonic polyps, hx of Vitiligo  Past Surgical History: Reviewed history from 12/25/2007 and no changes required. Status post orchiectomy  Family History: Reviewed history from 10/22/2007 and no changes required. Family History  High cholesterol Family History Hypertension denies history of premature coronary disease. Denies family history of diabetes and  Social History: Reviewed history from 12/25/2007 and no changes required. 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 No apparent alopecia or balding. Eyes:  No corneal or conjunctival inflammation noted. EOMI. Perrla. Funduscopic exam benign, without hemorrhages, exudates or papilledema. Vision grossly normal. Ears:  External ear exam shows no significant lesions or deformities.  Otoscopic examination reveals clear canals, tympanic membranes are intact bilaterally without bulging, retraction, inflammation or discharge. Hearing is grossly normal bilaterally. Nose:  External nasal examination shows no deformity or inflammation. Nasal mucosa are pink and moist without lesions or exudates. Mouth:  Oral mucosa and oropharynx without lesions or exudates.  Teeth in good repair. Neck:  No deformities, masses, or tenderness noted. Chest Wall:  No deformities, masses, tenderness or gynecomastia noted. Breasts:  No masses or gynecomastia noted Lungs:  Normal respiratory effort, chest expands symmetrically. Lungs are clear to auscultation, no crackles or wheezes. Heart:  Normal rate and regular rhythm. S1 and S2 normal without gallop, murmur, click, rub or other extra sounds. Abdomen:  Bowel sounds positive,abdomen soft and non-tender without masses,  organomegaly or hernias noted. Genitalia:  Testes bilaterally descended without nodularity, tenderness or masses. No scrotal masses or lesions. No penis lesions or urethral discharge. Prostate:  uro Msk:  No deformity  or scoliosis noted of thoracic or lumbar spine.   Pulses:  R and L carotid,radial,femoral,dorsalis pedis and posterior tibial pulses are full and equal bilaterally Extremities:  No clubbing, cyanosis, edema, or deformity noted with normal full range of motion of all joints.   Neurologic:  No cranial nerve deficits noted. Station and gait are normal. Plantar reflexes are down-going bilaterally. DTRs are symmetrical throughout. Sensory, motor and coordinative functions appear intact. Skin:  Intact without suspicious lesions or rashes......Marland Kitchenexcept 9  this 10 toenails have fungus.  They were trimmed by me Cervical Nodes:  No lymphadenopathy noted Axillary Nodes:  No palpable lymphadenopathy Inguinal Nodes:  No significant adenopathy Psych:  Cognition and judgment appear intact. Alert and cooperative with normal attention span and concentration. No apparent delusions, illusions, hallucinations   Impression & Recommendations:  Problem # 1:  TESTOSTERONE DEFICIENCY (ICD-257.2) Assessment Unchanged  Orders: Prescription Created Electronically [phone])  Problem # 2:  HYPERTENSION (ICD-401.9) Assessment: Improved  His updated medication list for this problem includes:    Tenoretic 50 50-25 Mg Tabs (Atenolol-chlorthalidone) .Marland Kitchen... 1 once daily  Orders: Prescription Created Electronically [phone]) EKG w/ Interpretation (93[address])  Problem # 3:  DYSLIPIDEMIA (ICD-272.4) Assessment: Improved  His updated medication list for this problem includes:    Crestor 10 Mg Tabs (Rosuvastatin calcium) .Marland Kitchen... Take 1 tab at bedtime  Orders: Prescription Created Electronically [phone]) EKG w/ Interpretation (93[address])  Complete Medication List: 1)  Tenoretic 50 50-25 Mg Tabs (Atenolol-chlorthalidone) .Marland Kitchen.. 1 once daily 2)  Klor-con M20 20 Meq Tbcr (Potassium chloride crys cr) .... 3 qam 3)  Crestor 10 Mg Tabs (Rosuvastatin calcium) .... Take 1 tab at bedtime 4)  Aspir-low 81 Mg Tbec (Aspirin) .... Once daily 5)   Fish Oil 1000 Mg Caps (Omega-3 fatty acids) .... Once daily 6)  Rapaflo 4 Mg Caps (Silodosin) .... Once daily 7)  Oxybutynin Chloride 5 Mg Tabs (Oxybutynin chloride) .... Once daily 8)  Androgel Pump 1 % Gel (Testosterone) .... 2 pumps daily  Patient Instructions: 1)  soak and file, your toenails, weekly, the goal is to remove all the dead nail. 2)  Restart the AndroGel .... get a follow-up blood test in 8 weeks after you views the AndroGel consistently 3)  Please schedule a follow-up appointment in 1 year. 4)  Take an Aspirin every day. 5)  take 3, potassium tablets daily Prescriptions: ANDROGEL PUMP 1 % GEL (TESTOSTERONE) 2 pumps daily  #3 units x 3   Entered and Authorized by:   Roderick Pee MD   Signed by:   Roderick Pee MD on 12/25/2009   Method used:   Print then Give to Patient   RxID:   [id number] CRESTOR 10 MG  TABS (ROSUVASTATIN CALCIUM) Take 1 tab at bedtime  #100 x 3   Entered and Authorized by:   Roderick Pee MD   Signed by:   Roderick Pee MD on 12/25/2009   Method used:   Electronically to        [facility]* (mail-order)             ,          Ph: [phone]       Fax: [phone]   RxID:   [id number] TENORETIC 50 50-25 MG  TABS (ATENOLOL-CHLORTHALIDONE) 1 once  daily  #100 Tablet x 3   Entered and Authorized by:   Roderick Pee MD   Signed by:   Roderick Pee MD on 12/25/2009   Method used:   Electronically to        [facility]* (mail-order)             ,          Ph: [phone]       Fax: [phone]   RxID:   [id number] KLOR-CON M20 20 MEQ  TBCR (POTASSIUM CHLORIDE CRYS CR) 3 qam  #300 x 3   Entered and Authorized by:   Roderick Pee MD   Signed by:   Roderick Pee MD on 12/25/2009   Method used:   Electronically to        [facility]* (mail-order)             ,          Ph: [phone]       Fax: [phone]   RxID:   [id number]

## 2010-12-18 NOTE — Progress Notes (Signed)
Summary: Copay and complaints  Phone Note Call from Patient   Caller: Spouse Summary of Call: Patient's wife called first about having to pay copay for wellness visit.  I told her that the patient's insurance card indicates that a copay is due for every office visit and will be collected by the registrars at each visit.  I also assured her that if her insurance paid and there was a credit on the patient's account, she would be refunded the amount of the credit.  The wife also complained that she was not told that Dr. Artist Pais is a DO, and it did not come to her husband's attention until he came in to pick up some samples recently.  She demanded that I explain what exactly a DO is, and I told her that Dr. Artist Pais is an osteopathic physician.  She continued to question me about what that meant and why he had not done the same routine tests that other doctors have done on her husband.  I told her that this matter was between the doctor and the patient and that I did not feel that I could answer her questions satisfactorily.  She continued to badger and insult me until I told her that I was ending the conversation.  Initial call taken by: Eilene Ghazi Site Manager,  November 03, 2007 3:07 PM  Follow-up for Phone Call        noted.  I will discuss with patient at his next ov Follow-up by: D Thomos Lemons DO,  November 03, 2007 6:58 PM

## 2010-12-18 NOTE — Assessment & Plan Note (Signed)
Summary: 2 MO ROA/NML   Vital Signs:  Patient Profile:   55 Years Old Male Height:     73.5 inches Weight:      247.13 pounds BMI:     32.28 Temp:     97.0 degrees F oral Pulse rate:   70 / minute BP sitting:   119 / 77  (right arm)  Vitals Entered By: Glendell Docker (December 25, 2007 8:28 AM)                 Chief Complaint:  2 MONTH FOLLOW UP.  History of Present Illness:  Hyperlipidemia Follow-Up      This is a 55 year old man who presents for Hyperlipidemia follow-up.  The patient denies muscle aches and constipation.  The patient denies the following symptoms: chest pain/pressure.  He states he has been taking Crestor regularly.  We reviewed recent lipid panel.  There has been several phone calls between office staff and his wife with multiple concerns.    Current Allergies (reviewed today): No known allergies   Past Medical History:    Hyperlipidemia    Hypertension    Testicular cancer - followed by Dr. Myna Hidalgo    Colonic polyps, hx of    Vitiligo  Past Surgical History:    Status post orchiectomy   Family History:    Reviewed history from 10/22/2007 and no changes required:       Family History High cholesterol       Family History Hypertension       denies history of premature coronary disease.       Denies family history of diabetes and  Social History:    Reviewed history from 10/22/2007 and no changes required:       Married with two children ages 66, 52       Occupation: Ecologist        Never Smoked       Alcohol use-yes (social)    Review of Systems      See HPI   Physical Exam  General:     alert, well-developed, and well-nourished.   Neck:     supple and no carotid bruits.   Lungs:     Normal respiratory effort, chest expands symmetrically. Lungs are clear to auscultation, no crackles or wheezes. Heart:     Normal rate and regular rhythm. S1 and S2 normal without gallop, murmur, click, rub or other extra  sounds. Prostate:     no gland enlargement, no nodules, no asymmetry, and no induration.   Extremities:     No lower extremity edema     Impression & Recommendations:  Problem # 1:  DYSLIPIDEMIA (ICD-272.4) Unclear whether patient taking Crestor regularly.   He denies side effects.  He has not started fish oil supplements as recommended.  I urged he start 2 caps by mouth two times a day. We have not been able to establish a therapeutic relationship.  His wife has had multiple concerns.  I recommended follow up with Dr. Tawanna Cooler.  His updated medication list for this problem includes:    Crestor 10 Mg Tabs (Rosuvastatin calcium) .Marland Kitchen... Take 1 tablet by mouth once a day   Problem # 2:  HYPERTENSION (ICD-401.9) BP is stable.  Maintain current medication regimen.  His updated medication list for this problem includes:    Tenoretic 50 50-25 Mg Tabs (Atenolol-chlorthalidone) .Marland Kitchen... 1 once daily  BP today: 119/77 Prior BP: 124/77 (10/22/2007)  Labs Reviewed:  Creat: 1.1 (10/19/2007) Chol: 182 (12/22/2007)   HDL: 33.5 (12/22/2007)   LDL: DEL (12/22/2007)   TG: 320 (12/22/2007)   Problem # 3:  COLONIC POLYPS, HX OF (ICD-V12.72) He does not recall who performed his last colonoscopy.   I could not find colonoscopy report in his chart or  E Chart.  I advised patient review his records and call his previous GI physician to determine need for follow up colonoscopy.  Problem # 4:  SPECIAL SCREENING MALIGNANT NEOPLASM OF PROSTATE (ICD-V76.44) Normal prostate exam.  PSA before follow up visit with Dr. Tawanna Cooler.  Complete Medication List: 1)  Tenoretic 50 50-25 Mg Tabs (Atenolol-chlorthalidone) .Marland Kitchen.. 1 once daily 2)  Klor-con M20 20 Meq Tbcr (Potassium chloride crys cr) .... One by mouth once daily 3)  Crestor 10 Mg Tabs (Rosuvastatin calcium) .... Take 1 tablet by mouth once a day 4)  Fish Oil Concentrate 1000 Mg Caps (Omega-3 fatty acids) .... 2 by mouth bid   Patient Instructions: 1)  Please  schedule a follow-up appointment in 2 months with Dr. Tawanna Cooler. 2)  Lipid Panel prior to visit, ICD-9: 272.4 3)  PSA prior to visit, ICD-9:V76.44 4)  Please return for lab work one (1) week before your next appointment.     ]   Preventive Care Screening  Last Flu Shot:    Date:  09/14/2007    Results:  given

## 2010-12-18 NOTE — Letter (Signed)
Summary: [facility]  [facility]   Imported By: Maryln Gottron 10/05/2010 14:54:42  _____________________________________________________________________  External Attachment:    Type:   Image     Comment:   External Document

## 2010-12-18 NOTE — Progress Notes (Signed)
Summary: VIT D LEVEL  Phone Note Call from Patient   Caller: Spouse Reason for Call: Acute Illness, Talk to Nurse Details for Reason: vit d level Summary of Call: patient's wife is calling because she would like vit d level added to her husband's cpx labs that were done today.  her cell number is [phone] she is in class from 9:30-11:15. Initial call taken by: Kern Reap CMA Duncan Dull),  December 18, 2009 10:03 AM  Follow-up for Phone Call        please add a vitamin D level per wife's request Follow-up by: Roderick Pee MD,  December 18, 2009 1:26 PM  Additional Follow-up for Phone Call Additional follow up Details #1::        Pts wife called to adv that she would like to have CRP as well as vit d test added to her husband (this pts) labs.....?? Additional Follow-up by: Debbra Riding,  December 18, 2009 2:47 PM    Additional Follow-up for Phone Call Additional follow up Details #2:: Follow-up by: .sign

## 2010-12-18 NOTE — Progress Notes (Signed)
Summary: crestor refill  Phone Note Refill Request Message from:  Fax from Pharmacy on December 22, 2008 4:41 PM  Refills Requested: Medication #1:  CRESTOR 10 MG  TABS Take 1 tab at bedtime Initial call taken by: Kern Reap CMA,  December 22, 2008 4:41 PM      Prescriptions: CRESTOR 10 MG  TABS (ROSUVASTATIN CALCIUM) Take 1 tab at bedtime  #100 x 3   Entered by:   Kern Reap CMA   Authorized by:   Roderick Pee MD   Signed by:   Kern Reap CMA on 12/22/2008   Method used:   [facility]        [facility] [address] [facility]       [address]       [place], [place]  [address]       Ph: [phone] or [phone]       Fax: [phone]   RxID:   [id number]

## 2010-12-20 NOTE — Assessment & Plan Note (Signed)
Summary: rov/sp   History of Present Illness: Brent Hansen is relatively new to the lipid clinic.  This is his first follow-up, with his initial appt being 1 month ago.  He was referred primarily for elevated triglycerides.  At this time, lifestyle changes including diet and exercise were discussed and Fenofibrate 160 mg daily was added to his lipid regimen.  Goals set at initial visit include decreasing alcohol (beer) consumption, choosing healthier snacks, and incorporating exercise 3 days per week.     Brent Hansen returns today for dyslipidemia follow-up.  Current lipid regimen includes Crestor 10 mg daily, Fenofibrate 160 mg daily, and Fish Oil 1,000 mg daily.  He is tolerating medications well and denies any adverse events.  He reports compliance during the weekdays.  His wife ensures he takes his medications by filling his pill box each week.  However, on the weekends, patient admits to missing his medications because he is out of his regular routine and simply forgets to take them.    Review of diet reveals the patient is attempting to maintain a somewhat healthy diet, but continues to dislike vegetables and is not eating 5 servings of fruits/vegetables per day.  He does enjoy eating starchy vegetables including corn and potatoes.  I have cautioned the patient regarding starchy vegetable intake as this is likely contributing to increased triglycerides as well as increased blood sugars.  Patient admits he was doing well for about 2 weeks regarding alcohol intake and healthy snack choices.  After this time, the holidays arrived and dietary considerations were forgotten.  Patient also reports not limiting portion control.  We have discussed limiting snack sizes as well as limiting starchy carbs to one serving per meal.   Patient has been exercising about once per week over the previous month.  He does have a membership to [employer].  He also owns a treadmill, but is extrememly bored by walking on the  treadmill.  His main barrier to exercising includes lack of time due to working long hours many days of the week.  He does say he would rather play tennis or some other team sport vs working out individually at [employer], and will try to do more of this once spring/summer arrives. Pt also owns a beach house and is much more active during the summer when he can ride his bike, play tennis, swim, etc at the beach.   Lipid Management Provider  Eda Keys, PharmD  Preventive Screening-Counseling & Management  Alcohol-Tobacco     Alcohol drinks/day: 2     Alcohol type: beer     Alcohol Counseling: to decrease amount and/or frequency of alcohol intake     Smoking Status: never  Allergies: No Known Drug Allergies  Family History: Pt reports both grandmother and grandfather had h/o MI, grandmother suffered multiple MIs Parents have not h/o MI, but do have HTN and HLP Family History High cholesterol Family History Hypertension denies history of premature coronary disease. Denies family history of diabetes   Vital Signs:  Patient profile:   55 year old male Weight:      150 pounds BP sitting:   118 / 74  (left arm)  Impression & Recommendations:  Problem # 1:  DYSLIPIDEMIA (ICD-272.4) Assessment Improved Lipid values are as follows:  TC 175, TG 359 (goal <150), LDL 96.5 (goal <100), HDL 35 (goal >40).  Patient is at goal for LDL, but remains out of goal for both TG and HDL.  However, patient's TG are  much improved vs last month when they were 617.  Patient has been fairly compliant with medications and is having no complaints at this time.  His diet was less than optimal over the holidays and included many sweets, etc, so I anticipate seeing even more improvement over the next month considering he will not be having as much "holiday food and drink".  Patient has started exercising slightly but will improve this over the next month.   Plan:   Increase Fish Oil to 1 capsule two times a  day Continue making the healthiest snack choices and limit portion size of these snacks and limit alcohol intake Limit starchy carbohydrates to one serving per meal and watch serving size Attempt to exercise 2-3 times per week for at least 30 minutes each time Assess compliance at next follow-up (is patient getting in weekend doses?) Follow-up in 1 month  His updated medication list for this problem includes:    Crestor 10 Mg Tabs (Rosuvastatin calcium) .Marland Kitchen... Take 1 tab at bedtime    Fenofibrate 160 Mg Tabs (Fenofibrate) .Marland Kitchen... Take one tablet by mouth daily with a meal  Patient Instructions: 1)  Continue working on making the healthiest snack choices and limiting portion sizes. 2)  Limit starchy carbohydrates to one serving per meal (watch serving sizes!) 3)  Attempt to exercise 2-3 times per week for at least 30 minutes each time.  4)  Increase Fish Oil to 1 capsule twice daily. 5)  Return to clinic in 1 month 6)  Labwork at [facility] on Tues, Feb 21(FASTING) 7)  [facility]id Clinic on [place] on [address], Feb 23 @ 8:30 am

## 2011-01-01 ENCOUNTER — Other Ambulatory Visit: Payer: Self-pay

## 2011-01-08 ENCOUNTER — Other Ambulatory Visit: Payer: Self-pay

## 2011-01-08 ENCOUNTER — Encounter: Payer: Self-pay | Admitting: Family Medicine

## 2011-01-10 ENCOUNTER — Ambulatory Visit: Payer: Self-pay

## 2011-01-29 ENCOUNTER — Other Ambulatory Visit: Payer: 59

## 2011-01-29 ENCOUNTER — Encounter (INDEPENDENT_AMBULATORY_CARE_PROVIDER_SITE_OTHER): Payer: Self-pay | Admitting: *Deleted

## 2011-01-29 ENCOUNTER — Telehealth: Payer: Self-pay | Admitting: Family Medicine

## 2011-01-29 ENCOUNTER — Other Ambulatory Visit: Payer: Self-pay | Admitting: Family Medicine

## 2011-01-29 DIAGNOSIS — Z0000 Encounter for general adult medical examination without abnormal findings: Secondary | ICD-10-CM

## 2011-01-29 DIAGNOSIS — E785 Hyperlipidemia, unspecified: Secondary | ICD-10-CM

## 2011-01-29 DIAGNOSIS — Z0389 Encounter for observation for other suspected diseases and conditions ruled out: Secondary | ICD-10-CM

## 2011-01-29 LAB — PSA: PSA: 0.62 ng/mL (ref 0.10–4.00)

## 2011-01-29 LAB — HEPATIC FUNCTION PANEL
Albumin: 4 g/dL (ref 3.5–5.2)
Alkaline Phosphatase: 29 U/L — ABNORMAL LOW (ref 39–117)
Total Protein: 6.6 g/dL (ref 6.0–8.3)

## 2011-01-29 LAB — LIPID PANEL: Triglycerides: 726 mg/dL — ABNORMAL HIGH (ref 0.0–149.0)

## 2011-01-29 LAB — URINALYSIS, ROUTINE W REFLEX MICROSCOPIC
Ketones, ur: NEGATIVE
Leukocytes, UA: NEGATIVE
Nitrite: NEGATIVE
Specific Gravity, Urine: 1.025 (ref 1.000–1.030)
Total Protein, Urine: NEGATIVE
pH: 6 (ref 5.0–8.0)

## 2011-01-29 LAB — CBC WITH DIFFERENTIAL/PLATELET
Basophils Absolute: 0.1 10*3/uL (ref 0.0–0.1)
Basophils Relative: 0.6 % (ref 0.0–3.0)
Eosinophils Absolute: 0.4 10*3/uL (ref 0.0–0.7)
Lymphocytes Relative: 22.8 % (ref 12.0–46.0)
Lymphs Abs: 1.8 10*3/uL (ref 0.7–4.0)
Monocytes Absolute: 0.8 10*3/uL (ref 0.1–1.0)
Neutro Abs: 5 10*3/uL (ref 1.4–7.7)
RDW: 14 % (ref 11.5–14.6)
WBC: 8.1 10*3/uL (ref 4.5–10.5)

## 2011-01-29 LAB — LDL CHOLESTEROL, DIRECT: Direct LDL: 93.7 mg/dL

## 2011-01-29 LAB — TSH: TSH: 1.93 u[IU]/mL (ref 0.35–5.50)

## 2011-01-29 LAB — BASIC METABOLIC PANEL
BUN: 18 mg/dL (ref 6–23)
Calcium: 9.3 mg/dL (ref 8.4–10.5)
Creatinine, Ser: 1.3 mg/dL (ref 0.4–1.5)
GFR: 59.69 mL/min — ABNORMAL LOW (ref >60.00)

## 2011-01-29 NOTE — Telephone Encounter (Signed)
Fat-free diet, no alcohol, stop the Nizoral, walk daily.  Follow up ASAP

## 2011-02-01 ENCOUNTER — Encounter: Payer: Self-pay | Admitting: Family Medicine

## 2011-02-02 ENCOUNTER — Other Ambulatory Visit: Payer: Self-pay | Admitting: Family Medicine

## 2011-02-02 NOTE — Telephone Encounter (Signed)
Okay to fill? 

## 2011-02-04 ENCOUNTER — Encounter: Payer: Self-pay | Admitting: Family Medicine

## 2011-02-04 ENCOUNTER — Ambulatory Visit (INDEPENDENT_AMBULATORY_CARE_PROVIDER_SITE_OTHER): Payer: 59 | Admitting: Family Medicine

## 2011-02-04 DIAGNOSIS — E291 Testicular hypofunction: Secondary | ICD-10-CM

## 2011-02-04 DIAGNOSIS — I10 Essential (primary) hypertension: Secondary | ICD-10-CM

## 2011-02-04 DIAGNOSIS — R413 Other amnesia: Secondary | ICD-10-CM

## 2011-02-04 DIAGNOSIS — E785 Hyperlipidemia, unspecified: Secondary | ICD-10-CM

## 2011-02-04 MED ORDER — ATENOLOL-CHLORTHALIDONE 50-25 MG PO TABS: 1.0000 | ORAL_TABLET | Freq: Every day | ORAL | Status: DC

## 2011-02-04 MED ORDER — ROSUVASTATIN CALCIUM 10 MG PO TABS: 10.0000 mg | ORAL_TABLET | Freq: Every day | ORAL | Status: DC

## 2011-02-04 MED ORDER — FENOFIBRATE 160 MG PO TABS: 160.0000 mg | ORAL_TABLET | Freq: Every day | ORAL | Status: DC

## 2011-02-04 MED ORDER — TESTOSTERONE 5 MG/24HR TD PT24: 1.0000 | MEDICATED_PATCH | Freq: Every day | TRANSDERMAL | Status: DC

## 2011-02-04 NOTE — Patient Instructions (Signed)
Continue fenofibrate one daily, and decrease the Crestor to 10 mg Monday and Thursday.  Begin the exercise program and diet.  Follow-up in 3 months fasting lipid panel one week prior to visit.  Continue blood pressure medication daily.  Let's try the patches, one daily.  I would recommend  Dr. Mia Creek for an eye exam

## 2011-02-04 NOTE — Progress Notes (Signed)
Subjective:    Patient ID: Brent Hansen, male    DOB: 11-01-1956, 55 y.o.   MRN: [id number]  HPI Brent Hansen is a 55 year old, married male, nonsmoker, who comes in today for evaluation of hypertension, hyperlipidemia, low testosterone, complaints of memory loss,  His hypertension is treated with Tenoretic 50 -- 25 daily.  BP 118/78.  His hyperlipidemia is treated with Crestor 10 mg daily and fenofibrate 160 mg daily however, lipids are still not at goal.  Recent triglyceride level 7, 26.  Patient admits to not following his diet drinking a lot of caffeine and excessive alcohol in the evening because of job stress.  However, he has changed his diet decrease his alcohol consumption and signed out to get and begin an exercise program.  We sent him to the lipid clinic.  However, he didn't think that they had much to offer.  Is also concerned about the cholesterol lowing medication causing changes in his memory and he would like to take it twice weekly.or  stop it completely. We stopped it completely a year or two ago, however, his lipids went up very high.  He spent on Lamisil for fungal infection in his toenails.  Advised to stop that , Soak and file your nails  weekly at home.  He also has a history of low testosterone.  We gave Anddogel,  however, he can't remember to use it.  He would like to try the patches   Review of Systems  Constitutional: Negative.   HENT: Negative.   Eyes: Negative.   Respiratory: Negative.   Cardiovascular: Negative.   Gastrointestinal: Negative.   Genitourinary: Negative.   Musculoskeletal: Negative.   Skin: Negative.   Neurological: Negative.   Hematological: Negative.   Psychiatric/Behavioral: Negative.        Objective:   Physical Exam  Constitutional: He is oriented to person, place, and time. He appears well-developed and well-nourished.  HENT:  Head: Normocephalic and atraumatic.  Right Ear: External ear normal.  Left Ear: External ear normal.  Nose:  Nose normal.  Mouth/Throat: Oropharynx is clear and moist.  Eyes: Conjunctivae and EOM are normal. Pupils are equal, round, and reactive to light.  Neck: Normal range of motion. Neck supple. No JVD present. No tracheal deviation present. No thyromegaly present.  Cardiovascular: Normal rate, regular rhythm, normal heart sounds and intact distal pulses.  Exam reveals no gallop and no friction rub.   No murmur heard. Pulmonary/Chest: Effort normal and breath sounds normal. No stridor. No respiratory distress. He has no wheezes. He has no rales. He exhibits no tenderness.  Abdominal: Soft. Bowel sounds are normal. He exhibits no distension and no mass. There is no tenderness. There is no rebound and no guarding.  Genitourinary: Rectum normal, prostate normal and penis normal. Guaiac negative stool. No penile tenderness.  Musculoskeletal: Normal range of motion. He exhibits no edema and no tenderness.  Lymphadenopathy:    He has no cervical adenopathy.  Neurological: He is alert and oriented to person, place, and time. He has normal reflexes. No cranial nerve deficit. He exhibits normal muscle tone.  Skin: Skin is warm and dry. No rash noted. No erythema. No pallor.  Psychiatric: He has a normal mood and affect. His behavior is normal. Judgment and thought content normal.          Assessment & Plan:  Hypertension continue medication.  Hyperlipidemia.  Continue the fenofibrate, however, take the Crestor one on Monday, and one on Thursday, and most importantly begin the  diet and exercise program.  Follow-up in 3 months to check lipids.  Use androderm once daily.  Soak and file your toenails, weekly, do not take the antifungal medication

## 2011-02-15 ENCOUNTER — Telehealth: Payer: Self-pay | Admitting: *Deleted

## 2011-02-15 NOTE — Telephone Encounter (Signed)
Has a question about a patch rx.  Message was on triage v/m

## 2011-02-18 ENCOUNTER — Telehealth: Payer: Self-pay | Admitting: *Deleted

## 2011-02-18 NOTE — Telephone Encounter (Signed)
Left message on machine for patient  That and Androderm is no longer made and [facility] needs a new prescription.  He should call his insurance company to find which Rx they cover.

## 2011-02-20 MED ORDER — TESTOSTERONE 4 MG/24HR TD PT24: 1.0000 | MEDICATED_PATCH | TRANSDERMAL | Status: DC

## 2011-02-20 NOTE — Telephone Encounter (Signed)
New Rx sent.

## 2011-02-20 NOTE — Telephone Encounter (Signed)
rx called into the pharmacy  

## 2011-02-26 LAB — CBC
HCT: 42.5 % (ref 39.0–52.0)
Hemoglobin: 15.1 g/dL (ref 13.0–17.0)
MCHC: 35.4 g/dL (ref 30.0–36.0)
RBC: 4.8 MIL/uL (ref 4.22–5.81)
RDW: 13.4 % (ref 11.5–15.5)

## 2011-02-26 LAB — DIFFERENTIAL
Eosinophils Relative: 5 % (ref 0–5)
Lymphocytes Relative: 23 % (ref 12–46)
Monocytes Absolute: 0.7 10*3/uL (ref 0.1–1.0)
Monocytes Relative: 8 % (ref 3–12)
Neutro Abs: 4.9 10*3/uL (ref 1.7–7.7)

## 2011-02-26 LAB — BASIC METABOLIC PANEL
CO2: 30 mEq/L (ref 19–32)
Calcium: 9.1 mg/dL (ref 8.4–10.5)
GFR calc Af Amer: >60 mL/min (ref >60)
Glucose, Bld: 108 mg/dL — ABNORMAL HIGH (ref 70–99)
Potassium: 3.7 mEq/L (ref 3.5–5.1)
Sodium: 139 mEq/L (ref 135–145)

## 2011-02-26 LAB — POCT HEMOGLOBIN-HEMACUE: Hemoglobin: 15.3 g/dL (ref 13.0–17.0)

## 2011-04-02 NOTE — Op Note (Signed)
NAME:  Brent Hansen, Brent Hansen NO.:  [id number]   MEDICAL RECORD NO.:  [id number]          PATIENT TYPE:  AMB   LOCATION:  DSC                          FACILITY:  [facility]   PHYSICIAN:  Juanetta Gosling, MDDATE OF BIRTH:  May 30, 1956   DATE OF PROCEDURE:  03/24/2009  DATE OF DISCHARGE:                               OPERATIVE REPORT   PREOPERATIVE DIAGNOSIS:  Umbilical hernia.   POSTOPERATIVE DIAGNOSIS:  Umbilical hernia.   PROCEDURE:  Umbilical hernia repair with 4.2-cm Proceed Ventral Patch.   SURGEON:  Troy Sine. Dwain Sarna, MD   ASSISTANT:  None.   ANESTHESIA:  General.   FINDINGS:  A 1.5 cm umbilical hernia.   SPECIMENS:  None.   DRAINS:  None.   COMPLICATIONS:  None.   ESTIMATED BLOOD LOSS:  Minimal.   DISPOSITION:  To recovery room in stable condition.   SUPERVISING ANESTHESIOLOGIST:  Bedelia Person, MD   INDICATIONS:  Brent Hansen is a 55 year old male with several-year history  of umbilical bulge, that does not ever really reduce.  He was sent for  evaluation for that.  He does have a history of testicular cancer in  2001.  On his exam, he had a producible 1.5-cm umbilical hernia.  We  discussed an open repair, either primary or with mesh, dependent on what  size it was and the appearance of his fascia.  We did discuss at length  the risks, benefits, and indications of this operation.   PROCEDURE IN DETAIL:  After informed consent was obtained, the patient  was taken to the operating room.  He was administered 1 g of intravenous  cefazolin.  Sequential compression devices were placed on the lower  extremities prior to operation.  He was placed under general anesthesia  without complication.  His abdomen was then prepped with ChloraPrep.  Three minutes were allowed to pass.  He was then draped in a standard  sterile surgical fashion.  A surgical time-out was then performed.   A curvilinear incision was then made below the umbilicus and dissection  was  carried out down to the level of the umbilical stalk.  This was then  encircled with a Kelly clamp and divided with electrocautery taking care  not to injure the skin.  Following this, he had about 1.5 cm hernia.  There was some lax fascia around this, this went into his peritoneum as  well.  I elected to place a 4.2-cm Proceed Ventral Patch which was  placed in good position after clearing off the fascia anteriorly.  This  was then brought up.  A 0 Prolene suture was used to suture the tag into  place to the edge of the fascia as well as when I laid it down over the  anterior layer of the fascia the mesh on both sides.  The mesh was in  good position upon completion.  Hemostasis was observed.  A 3-0 undyed  Vicryl was used to tack his umbilicus back down.  A 2-0 Vicryl was used  to close the dermis and a 4-0 Monocryl was used to close the skin  in  subcuticular fashion.  Steri-Strips and sterile dressing were placed  over this.  A 10 mL of 0.25% Marcaine were infiltrated into the wound.  He tolerated this well, was extubated in the operating room, and  transferred to recovery room in stable condition.      Juanetta Gosling, MD  Electronically Signed     MCW/MEDQ  D:  03/24/2009  T:  03/25/2009  Job:  [id number]   cc:   Tinnie Gens A. Tawanna Cooler, MD

## 2011-04-02 NOTE — Consult Note (Signed)
NAME:  Brent Hansen, Brent Hansen NO.:  [id number]   MEDICAL RECORD NO.:  [id number]          PATIENT TYPE:  EMS   LOCATION:  ED                           [facility]ACILITY[facility]   PHYSICIAN:  Juanetta Gosling, MDDATE OF BIRTH:  Mar 27, 1956   DATE OF CONSULTATION:  06/22/2008  DATE OF DISCHARGE:                                 CONSULTATION   REFERRING PHYSICIAN:  Dr. Tawanna Cooler.   REASON FOR CONSULTATION:  To rule out appendicitis.   HISTORY OF PRESENT ILLNESS:  Brent Hansen is a 55 year old male who had the  onset of periumbilical pain this morning approximately 6:15 that began  worsening when he went to work.  He went to his primary care physician,  Dr. Tawanna Cooler, for evaluation of that.  Denies any fevers, nausea, vomiting.  Had a normal bowel movement this morning.  Was passing gas normally  yesterday.  He was seen with a concern for appendicitis, and was  referred to the emergency room.  In the ED when I saw him he had  received some morphine, but he had no pain and had no current  complaints.  He reports colonoscopy several years ago which was normal.  He does have a past history of some polyps that were biopsied.  No  report of any diverticula at present.   PAST MEDICAL HISTORY:  1. Hypercholesterolemia.  2. Hypertension.   PAST SURGICAL HISTORY:  1. Tonsillectomy.  2. He had his left testicle removed for testicular cancer.   MEDICATIONS:  1. Klor-Con 20 daily.  2. Tenoretic.  3. Crestor.   ALLERGIES:  No known drug allergies.   SOCIAL HISTORY:  Nonsmoker, no alcohol.   PHYSICAL EXAMINATION:  Temp 97.6, pulse 56, blood pressure 110/74,  respirations 16.  CARDIOVASCULAR:  Regular rate and rhythm without murmurs.  PULMONARY:  Clear bilaterally.  ABDOMEN:  Soft, nontender, nondistended.  He had bowel sounds present.  He had a reducible nontender umbilical hernia.  He had no groin hernia  on his exam.   LABORATORY DATA:  Clinger blood cell count 9.2, platelets 200.   Amylase  68, lipase 33.  Creatinine 1.03.  His LFTs were normal, and his  urinalysis is normal as well.   His chest x-ray was normal.  He had a CT scan that I reviewed with the  radiologist that showed some mesenteric adenitis and a small umbilical  hernia consistent with my examination, but was, otherwise, normal with  normal appendix, no stranding, and no evidence of any free fluid.  He  also had no evidence of diverticular disease.   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  No evidence on exam or  the imaging that he needs any surgical intervention.  We discussed  observation versus discharge.  He would like to go home.  I warned him  for worrisome signs such as fever, nausea, vomiting, increasing pain,  and he will return if those develop.      Juanetta Gosling, MD  Electronically Signed     MCW/MEDQ  D:  06/22/2008  T:  06/22/2008  Job:  [id number]  cc:   Tinnie Gens A. Tawanna Cooler, MD  [address] [place]  [place] [address]

## 2011-04-05 NOTE — Assessment & Plan Note (Signed)
[facility]                              [facility] OFFICE NOTE   NAME:Peltz, MERVIN RAMIRES                       MRN:          [id number]  DATE:06/11/2005                            DOB:          1956-03-13    TELEPHONE MESSAGE NOTE:  On June 08, 2006, we got a fax from his wife,  Mindi Junker, about Shaklee vitamins, energy drinks, Cinch, Boost-1, a number of  dietary supplements. We called back on June 10, 2006 and left a message that  we are really not adversed in these types of supplements and that the best  thing to do would be, if she has a concern about these, is to see Omelia Blackwater  at [facility], who is well versed in this subject.   The patient called today, June 11, 2006, extremely upset that I would not  review all the information that she gave me and give me an opinion on the  different supplements, etc. I again explained to her that I am not well  versed in this issue and then when people have a question about this, I  refer them to Omelia Blackwater at [facility]. She then went on to say  well Dr. Everardo All spent 2 years mis-diagnosing her and on-and-on, and all  these other bazaar statements. Finally, I said I am sorry Ms. Tonner, I have  to go, I have patient's here. I am going to go ahead and hand up the phone  because I have patient's here to see. Then I again re-emphasized that since  I am not well versed in this issue, I would recommend that she see Omelia Blackwater  at [facility].   A very odd phone call and in addition to this, concerned about me not  reviewing this dietary information that she had. She also then went on these  tangents about Dr. Everardo All and her obstetrician and all these other  unrelated issues, So, I am not exactly sure what is going on with Kathlen Brunswick but I thought I ought to dictate a note because she was extremely  unhappy and I wanted to document, from my point of view, what happened.                        Jeffrey A. Tawanna Cooler, MD   JAT/MedQ  DD:  06/11/2006  DT:  06/11/2006  Job #:  [id number]

## 2011-04-05 NOTE — Op Note (Signed)
[facility]  Patient:    Brent Hansen, Brent Hansen                       MRN: [id number] Proc. Date: 05/12/00 Adm. Date:  [id number] Disc. Date: [id number] Attending:  Evlyn Clines CC:         Evette Georges, M.D. [facility]             Aura Dials, M.D.             Excell Seltzer. Annabell Howells, M.D.                           Operative Report  PROCEDURE:  Left radical orchiectomy.  PREOPERATIVE DIAGNOSIS:  Left testicular mass.  POSTOPERATIVE DIAGNOSIS:  Left testicular mass.  SURGEON:  Excell Seltzer. Annabell Howells, M.D.  ANESTHESIA:  General.  SPECIMEN:  Left testicle and cord.  COMPLICATIONS:  None.  INDICATION FOR PROCEDURE:  Brent Hansen is a 55 year old [place] male who originally presented with pain in the left testicle. A duplex ultrasound revealed a complex cystic mass within the parenchyma of the left testicle suspicious for tumor.  FINDINGS AND PROCEDURE:  The patient was taken to the operating room where a general anesthetic was induced. He was placed in the supine position. His left lower abdomen and groin were shaved. He was prepped with Betadine solution and draped in the usual sterile fashion. An oblique incision was made along the skin lines over the inguinal canal. The fat was incised down to the fascia with the Bovie. The external oblique fascia was incised along its fibers down to the external ring. Care was taken to avoid the ilioinguinal nerve. Once the fascia was opened and the nerve was protected, the cord was encircled and dissected back to the internal ring. It was then clamped with a double wrap with a 1/4 inch Penrose drain to control the vascular supply. The testicle was then delivered from the wound. The gubernacular attachments were taken down with the Bovie. At this point, once the testicle was in the wound, palpation did indicate a mass although it was somewhat subtle. I then draped out the field with additional towels and opened the tunica vaginalis  exposing the testicle. I then opened the tunica vaginalis over the mass and dissected down until I noted solid nodules within the testicle. I felt this was sufficient indication of a neoplasm and at that point placed the testicle into a rubber glove and changed gloves, discarded the instruments used for the testicular exploration and then I used hemostats and divided the cord into 3 packets which were clamped with hemostat and then divided. Each was doubly ligated with #0 silk tie and the specimen was removed from the table. The wound was then irrigated. The external oblique fascia was then closed using a running 2-0 Vicryl stitch care being taken to injure the nerve. The subcutaneous tissue was then closed using interrupted 3-0 chromic stitches and the skin was closed with a running 4-0 Vicryl intracuticular stitch. The skin incision was reinforced with tinctured Benzoin and Steri-Strips. The drapes were removed, the dressing of 4 x 4s was placed on the wound, fluffed Kerlix and scrotal support was then placed. The patients anesthetic was then reversed and he was moved to the recovery room in stable condition. There were no complications. The specimen was placed in formalin and was sent rush. I spoke to Dr. Reuel Boom. He is  going to try to have it available by Wednesday morning. DD:  05/12/00 TD:  05/14/00 Job: [id number]

## 2011-04-09 ENCOUNTER — Other Ambulatory Visit: Payer: Self-pay | Admitting: Family Medicine

## 2011-04-16 ENCOUNTER — Other Ambulatory Visit: Payer: Self-pay | Admitting: Family Medicine

## 2011-04-17 ENCOUNTER — Other Ambulatory Visit: Payer: Self-pay | Admitting: *Deleted

## 2011-04-24 ENCOUNTER — Other Ambulatory Visit: Payer: Self-pay | Admitting: *Deleted

## 2011-04-24 NOTE — Telephone Encounter (Signed)
Wife's cell number is [phone]

## 2011-04-24 NOTE — Telephone Encounter (Signed)
patient  Needs a refill of potassium but needs to know if he is taking one or two tabs daily.  [facility]

## 2011-04-25 ENCOUNTER — Other Ambulatory Visit (INDEPENDENT_AMBULATORY_CARE_PROVIDER_SITE_OTHER): Payer: 59

## 2011-04-25 ENCOUNTER — Other Ambulatory Visit: Payer: Self-pay | Admitting: Family Medicine

## 2011-04-25 ENCOUNTER — Other Ambulatory Visit: Payer: 59

## 2011-04-25 DIAGNOSIS — E785 Hyperlipidemia, unspecified: Secondary | ICD-10-CM

## 2011-04-25 LAB — LIPID PANEL
HDL: 33.9 mg/dL — ABNORMAL LOW (ref >39.00)
VLDL: 90.6 mg/dL — ABNORMAL HIGH (ref 0.0–40.0)

## 2011-04-25 LAB — LDL CHOLESTEROL, DIRECT: Direct LDL: 106.1 mg/dL

## 2011-04-25 MED ORDER — POTASSIUM CHLORIDE CRYS ER 20 MEQ PO TBCR: 20.0000 meq | EXTENDED_RELEASE_TABLET | Freq: Two times a day (BID) | ORAL | Status: DC

## 2011-04-25 NOTE — Telephone Encounter (Signed)
Left message on machine for patient   rx sent and called in

## 2011-04-25 NOTE — Telephone Encounter (Signed)
Okay to refill, potassium for one year or in tongue next physical exam is to check dose and frequency

## 2011-05-07 ENCOUNTER — Ambulatory Visit (INDEPENDENT_AMBULATORY_CARE_PROVIDER_SITE_OTHER): Payer: 59 | Admitting: Family Medicine

## 2011-05-07 ENCOUNTER — Encounter: Payer: Self-pay | Admitting: Family Medicine

## 2011-05-07 VITALS — BP 110/70 | Temp 98.7°F | Wt 235.0 lb

## 2011-05-07 DIAGNOSIS — E785 Hyperlipidemia, unspecified: Secondary | ICD-10-CM

## 2011-05-07 NOTE — Patient Instructions (Signed)
Continue diet and exercise program.  Increase the Crestor 10 mg Monday, Wednesday, Friday.  Follow-up fasting lipid panel in 3 months,,,,,,,,,,,, I will call you the report of the lipid panel

## 2011-05-07 NOTE — Progress Notes (Signed)
  Subjective:    Patient ID: Brent Hansen, male    DOB: Nov 18, 1956, 55 y.o.   MRN: [id number]  HPIThomas is a delightful, 55 year old male, who comes in today for follow-up of dyslipidemia, with marked elevation in triglycerides.  His triglyceride level was 720 63 months ago.  We started him on a diet and exercise program in the 80s lost about 25 pounds and his lipids at basically normalized.  We had to decrease the Crestor 10 mg Monday and Thursday because he thought he was having cognitive problems.  He seems mentally okay.  HDL slightly low LDL normal   Review of Systems    General metabolic review of systems otherwise negative Objective:   Physical Exam    Well-developed well-nourished, male in no acute distress    Assessment & Plan:  Dyslipidemia plan increase Crestor to Monday, Wednesday, Friday, 10 mg continue diet, exercise program.  Follow-up lipid panel in 3 months.  Also, continue the fibrate

## 2011-07-03 ENCOUNTER — Encounter: Payer: Self-pay | Admitting: Family Medicine

## 2011-07-03 ENCOUNTER — Ambulatory Visit (INDEPENDENT_AMBULATORY_CARE_PROVIDER_SITE_OTHER): Payer: 59 | Admitting: Family Medicine

## 2011-07-03 VITALS — BP 110/80 | Temp 99.0°F | Wt 223.0 lb

## 2011-07-03 DIAGNOSIS — H60332 Swimmer's ear, left ear: Secondary | ICD-10-CM

## 2011-07-03 DIAGNOSIS — H60399 Other infective otitis externa, unspecified ear: Secondary | ICD-10-CM

## 2011-07-03 MED ORDER — HYDROCODONE-ACETAMINOPHEN 7.5-750 MG PO TABS: ORAL_TABLET | ORAL | Status: DC

## 2011-07-03 NOTE — Patient Instructions (Signed)
Keep the wick in year.  Ear canal until Saturday morning.  One drop of the medicated drops in ear.  Left ear canal that time, x 10 nights.  Remove the wick on Saturday morning.  Vicodin one half to one tab t.i.d. P.r.n. Severe pain

## 2011-07-03 NOTE — Progress Notes (Signed)
  Subjective:    Patient ID: Brent Hansen, male    DOB: [birth year], 55 y.o.   MRN: [id number]  HPI  Brent Hansen  is a 55 year old, married male, nonsmoker, who comes in today for evaluation of pain in his left ear for one week.  He [place] at the beach and developed pain in his left ear.  He went to an urgent care this [place] and was diagnosed with swimmer's ear.  He was given Cipro drops with dexamethasone.  He seemed to be okay until last night when he woke up at 3 o'clock in the morning with severe pain.    Review of Systems General an ENT review of systems otherwise negative    Objective:   Physical Exam Well-developed well-nourished man in no acute distress.  Examination of right ear canal is normal.  Left ear shows swelling with debris.  He was taken to the treatment room and the debris was suctioned       Assessment & Plan:  Swimmer's ear left planned pack year canal with medication.  Return p.r.n.

## 2011-07-18 ENCOUNTER — Ambulatory Visit (INDEPENDENT_AMBULATORY_CARE_PROVIDER_SITE_OTHER): Payer: 59 | Admitting: Family Medicine

## 2011-07-18 ENCOUNTER — Encounter: Payer: Self-pay | Admitting: Family Medicine

## 2011-07-18 VITALS — BP 110/70 | Temp 97.4°F | Wt 221.0 lb

## 2011-07-18 DIAGNOSIS — H60399 Other infective otitis externa, unspecified ear: Secondary | ICD-10-CM

## 2011-07-18 DIAGNOSIS — H60332 Swimmer's ear, left ear: Secondary | ICD-10-CM

## 2011-07-18 MED ORDER — CIPROFLOXACIN-HYDROCORTISONE 0.2-1 % OT SUSP: OTIC | Status: AC

## 2011-07-18 NOTE — Progress Notes (Signed)
  Subjective:    Patient ID: CARI VANDEBERG, male    DOB: [birth year], 55 y.o.   MRN: [id number]  HPI This is a 55 year old, married man, nonsmoker, who comes in today for reevaluation of otitis external.  We had E.Mailed in Cipro otic drops to the beach for him 3 weeks ago.  He used it for a week.  It cleared up and, now it's back.   Review of Systems    General any anterior systems otherwise negative Objective:   Physical Exam  Without limits.  Menarche distress.  Examination of right ear canal is normal.  Left ear canal shows no evidence of any residual H. Today to however, there is some persistent erythema of the canal      Assessment & Plan:  Left otitis externa.  Plan Cipro otic two drops nightly x 2 weeks

## 2011-07-18 NOTE — Patient Instructions (Signed)
Two drops left ear canal.  Bedtime, x 2 weeks.  Return p.r.n.

## 2011-07-26 ENCOUNTER — Telehealth: Payer: Self-pay | Admitting: *Deleted

## 2011-07-26 DIAGNOSIS — H60332 Swimmer's ear, left ear: Secondary | ICD-10-CM

## 2011-07-26 DIAGNOSIS — J301 Allergic rhinitis due to pollen: Secondary | ICD-10-CM

## 2011-07-26 NOTE — Telephone Encounter (Signed)
Pt would like a referral to ENT ASAP, please.  Has seen Urgent Care once and Dr. Tawanna Cooler twice.

## 2011-07-29 NOTE — Telephone Encounter (Signed)
Dr. Narda Bonds, and ENT.......... He can make his own appointment.  He does not need a referral

## 2011-08-07 ENCOUNTER — Other Ambulatory Visit (INDEPENDENT_AMBULATORY_CARE_PROVIDER_SITE_OTHER): Payer: 59

## 2011-08-07 DIAGNOSIS — E785 Hyperlipidemia, unspecified: Secondary | ICD-10-CM

## 2011-08-07 LAB — HEPATIC FUNCTION PANEL
AST: 30 U/L (ref 0–37)
Total Bilirubin: 0.7 mg/dL (ref 0.3–1.2)

## 2011-08-07 LAB — LIPID PANEL
HDL: 41.2 mg/dL (ref >39.00)
Total CHOL/HDL Ratio: 4

## 2011-08-16 LAB — DIFFERENTIAL
Basophils Relative: 0
Lymphocytes Relative: 11 — ABNORMAL LOW
Monocytes Relative: 5
Neutro Abs: 7.5
Neutrophils Relative %: 82 — ABNORMAL HIGH

## 2011-08-16 LAB — COMPREHENSIVE METABOLIC PANEL
Albumin: 3.5
Alkaline Phosphatase: 30 — ABNORMAL LOW
BUN: 13
Calcium: 9.7
Creatinine, Ser: 1.03
Glucose, Bld: 127 — ABNORMAL HIGH
Potassium: 3.8
Total Protein: 6.5

## 2011-08-16 LAB — URINALYSIS, ROUTINE W REFLEX MICROSCOPIC
Bilirubin Urine: NEGATIVE
Hgb urine dipstick: NEGATIVE
Nitrite: NEGATIVE
Protein, ur: NEGATIVE
Urobilinogen, UA: 1

## 2011-08-16 LAB — AMYLASE: Amylase: 68

## 2011-08-16 LAB — CBC
HCT: 44.1
Hemoglobin: 15.3
RBC: 4.88
WBC: 9.2

## 2011-08-16 LAB — URINE CULTURE

## 2011-08-16 LAB — APTT: aPTT: 25

## 2011-08-16 LAB — PROTIME-INR
INR: 1
Prothrombin Time: 13.1

## 2011-11-08 ENCOUNTER — Encounter: Payer: Self-pay | Admitting: Family Medicine

## 2011-11-08 ENCOUNTER — Ambulatory Visit (INDEPENDENT_AMBULATORY_CARE_PROVIDER_SITE_OTHER): Payer: 59 | Admitting: Family Medicine

## 2011-11-08 VITALS — BP 122/80 | HR 96 | Temp 97.6°F | Wt 218.0 lb

## 2011-11-08 DIAGNOSIS — J40 Bronchitis, not specified as acute or chronic: Secondary | ICD-10-CM

## 2011-11-08 MED ORDER — AZITHROMYCIN 250 MG PO TABS: ORAL_TABLET | ORAL | Status: AC

## 2011-11-08 MED ORDER — HYDROCODONE-HOMATROPINE 5-1.5 MG/5ML PO SYRP: 5.0000 mL | ORAL_SOLUTION | ORAL | Status: AC | PRN

## 2011-11-08 NOTE — Progress Notes (Signed)
  Subjective:    Patient ID: Brent Hansen, male    DOB: 1956/08/26, 55 y.o.   MRN: [id number]  HPI Here for one week of chest congestion and coughing up green sputum. No fever.    Review of Systems  Constitutional: Negative.   HENT: Negative.   Eyes: Negative.   Respiratory: Positive for cough.        Objective:   Physical Exam  Constitutional: He appears well-developed and well-nourished.  HENT:  Right Ear: External ear normal.  Left Ear: External ear normal.  Nose: Nose normal.  Mouth/Throat: Oropharynx is clear and moist. No oropharyngeal exudate.  Eyes: Conjunctivae are normal.  Pulmonary/Chest: Effort normal. He has no wheezes. He has no rales.       Scattered rhonchi   Lymphadenopathy:    He has no cervical adenopathy.          Assessment & Plan:  Add Mucinex

## 2012-01-17 ENCOUNTER — Telehealth: Payer: Self-pay | Admitting: Family Medicine

## 2012-01-17 NOTE — Telephone Encounter (Signed)
Pts mother passed away unexpectedly and pt is having stress and anxiety issues. Pt has sch his cpx in April, said that Dr Tawanna Cooler had prescribed a med for him a while back, that helps calm pt down. Pt is wondering if Dr Taw[facility] Cooler [facility] [place]. Also FYI, Pt wanted Dr Tawanna Cooler to know that he has lost 38 lbs.

## 2012-01-20 MED ORDER — AMITRIPTYLINE HCL 25 MG PO TABS: 25.0000 mg | ORAL_TABLET | Freq: Every day | ORAL | Status: DC

## 2012-01-20 NOTE — Telephone Encounter (Signed)
Rx called in and wife is aware 

## 2012-01-20 NOTE — Telephone Encounter (Signed)
Suzzette Righter,,,,,,, please call and Elavil 25 mg dispense 30 tabs directions one by mouth each bedtime refills x2 for anxiety

## 2012-02-26 ENCOUNTER — Other Ambulatory Visit (INDEPENDENT_AMBULATORY_CARE_PROVIDER_SITE_OTHER): Payer: 59

## 2012-02-26 DIAGNOSIS — Z0000 Encounter for general adult medical examination without abnormal findings: Secondary | ICD-10-CM

## 2012-02-26 LAB — LIPID PANEL: VLDL: 24 mg/dL (ref 0.0–40.0)

## 2012-02-26 LAB — TESTOSTERONE: Testosterone: 285.91 ng/dL — ABNORMAL LOW (ref 350.00–890.00)

## 2012-02-26 LAB — CBC WITH DIFFERENTIAL/PLATELET
Basophils Relative: 0.8 % (ref 0.0–3.0)
Eosinophils Absolute: 0.3 10*3/uL (ref 0.0–0.7)
Hemoglobin: 14.5 g/dL (ref 13.0–17.0)
Lymphs Abs: 1.3 10*3/uL (ref 0.7–4.0)
MCHC: 33.4 g/dL (ref 30.0–36.0)
MCV: 91.3 fl (ref 78.0–100.0)
Monocytes Absolute: 0.5 10*3/uL (ref 0.1–1.0)
Neutro Abs: 4.3 10*3/uL (ref 1.4–7.7)
RBC: 4.76 Mil/uL (ref 4.22–5.81)

## 2012-02-26 LAB — POCT URINALYSIS DIPSTICK
Ketones, UA: NEGATIVE
Leukocytes, UA: NEGATIVE
Protein, UA: NEGATIVE
pH, UA: 6.5

## 2012-02-26 LAB — HEPATIC FUNCTION PANEL
Albumin: 4.1 g/dL (ref 3.5–5.2)
Alkaline Phosphatase: 26 U/L — ABNORMAL LOW (ref 39–117)
Total Protein: 6.5 g/dL (ref 6.0–8.3)

## 2012-02-26 LAB — BASIC METABOLIC PANEL
CO2: 25 mEq/L (ref 19–32)
Chloride: 106 mEq/L (ref 96–112)
Sodium: 142 mEq/L (ref 135–145)

## 2012-02-26 LAB — PSA: PSA: 0.42 ng/mL (ref 0.10–4.00)

## 2012-02-26 LAB — TSH: TSH: 1.49 u[IU]/mL (ref 0.35–5.50)

## 2012-02-26 NOTE — Progress Notes (Signed)
Addended by: Bonnye Fava on: 02/26/2012 09:45 AM   Modules accepted: Orders

## 2012-02-28 ENCOUNTER — Encounter: Payer: Self-pay | Admitting: Family Medicine

## 2012-02-28 ENCOUNTER — Ambulatory Visit (INDEPENDENT_AMBULATORY_CARE_PROVIDER_SITE_OTHER): Payer: 59 | Admitting: Family Medicine

## 2012-02-28 VITALS — BP 110/78 | Temp 98.1°F | Ht 72.5 in | Wt 219.0 lb

## 2012-02-28 DIAGNOSIS — L80 Vitiligo: Secondary | ICD-10-CM

## 2012-02-28 DIAGNOSIS — C6290 Malignant neoplasm of unspecified testis, unspecified whether descended or undescended: Secondary | ICD-10-CM

## 2012-02-28 DIAGNOSIS — E785 Hyperlipidemia, unspecified: Secondary | ICD-10-CM

## 2012-02-28 DIAGNOSIS — F419 Anxiety disorder, unspecified: Secondary | ICD-10-CM

## 2012-02-28 DIAGNOSIS — F411 Generalized anxiety disorder: Secondary | ICD-10-CM

## 2012-02-28 DIAGNOSIS — E291 Testicular hypofunction: Secondary | ICD-10-CM

## 2012-02-28 DIAGNOSIS — I10 Essential (primary) hypertension: Secondary | ICD-10-CM

## 2012-02-28 DIAGNOSIS — R7309 Other abnormal glucose: Secondary | ICD-10-CM

## 2012-02-28 MED ORDER — ROSUVASTATIN CALCIUM 10 MG PO TABS: 10.0000 mg | ORAL_TABLET | Freq: Every day | ORAL | Status: DC

## 2012-02-28 MED ORDER — AMITRIPTYLINE HCL 25 MG PO TABS: 25.0000 mg | ORAL_TABLET | Freq: Every day | ORAL | Status: DC

## 2012-02-28 MED ORDER — ATENOLOL-CHLORTHALIDONE 50-25 MG PO TABS: 1.0000 | ORAL_TABLET | Freq: Every day | ORAL | Status: DC

## 2012-02-28 MED ORDER — POTASSIUM CHLORIDE CRYS ER 20 MEQ PO TBCR: EXTENDED_RELEASE_TABLET | ORAL | Status: DC

## 2012-02-28 NOTE — Progress Notes (Signed)
Subjective:    Patient ID: Brent Hansen, male    DOB: 12/28/1955, 56 y.o.   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 Right testicle was removed he's been disease free since that time. He does have a history of low testosterone recent testosterone level,,,,,,,,, 285. At one point he was on a supplement but not now.  He takes Elavil 25 mg at bedtime for sleep dysfunction and anxiety  He takes Tenoretic 50 days 25 daily for hypertension BP 110/80 he's lost 37 pounds via diet exercise and wants to know if he can cut his blood pressure medicine down. He also has a history of hyperlipidemia he is on 10 mg of Crestor Monday Wednesday Friday and fenofibrate 160 mg daily. Lipids are normal. He stopped his triglyceride level from 726 year ago now the 120  He also has a history of frequent urination nocturia x3 we discussed various options including a trial of seen.      Review of Systems  Constitutional: Negative.   HENT: Negative.   Eyes: Negative.   Respiratory: Negative.   Cardiovascular: Negative.   Gastrointestinal: Negative.   Genitourinary: Negative.   Musculoskeletal: Negative.   Skin: Negative.   Neurological: Negative.   Hematological: Negative.   Psychiatric/Behavioral: Negative.        Objective:   Physical Exam  Constitutional: He is oriented to person, place, and time. He appears well-developed and well-nourished.  HENT:  Head: Normocephalic and atraumatic.  Right Ear: External ear normal.  Left Ear: External ear normal.  Nose: Nose normal.  Mouth/Throat: Oropharynx is clear and moist.  Eyes: Conjunctivae and EOM are normal. Pupils are equal, round, and reactive to light.  Neck: Normal range of motion. Neck supple. No JVD present. No tracheal deviation present. No thyromegaly present.  Cardiovascular: Normal rate, regular rhythm, normal heart  sounds and intact distal pulses.  Exam reveals no gallop and no friction rub.   No murmur heard. Pulmonary/Chest: Effort normal and breath sounds normal. No stridor. No respiratory distress. He has no wheezes. He has no rales. He exhibits no tenderness.  Abdominal: Soft. Bowel sounds are normal. He exhibits no distension and no mass. There is no tenderness. There is no rebound and no guarding.  Genitourinary: Prostate normal and penis normal. Guaiac negative stool. No penile tenderness.       Normal rectum except extremely tight sphincter  Musculoskeletal: Normal range of motion. He exhibits no edema and no tenderness.  Lymphadenopathy:    He has no cervical adenopathy.  Neurological: He is alert and oriented to person, place, and time. He has normal reflexes. No cranial nerve deficit. He exhibits normal muscle tone.  Skin: Skin is warm and dry. No rash noted. No erythema. No pallor.       Total body skin exam normal he does have vitiligo  Psychiatric: He has a normal mood and affect. His behavior is normal. Judgment and thought content normal.          Assessment & Plan:  Male  Hypertension decrease Tenoretic to one half tab daily BP check daily followup in 3 months  Sleep dysfunction and Elavil 25 each bedtime when necessary  Hyperlipidemia continue Crestor 10 mg Monday Wednesday Friday stop the fenofibrate check lipid panel in 6 weeks  Also decrease potassium supplement to one daily  Low testosterone level patient is content with the treatment  at this juncture

## 2012-02-28 NOTE — Patient Instructions (Signed)
Decrease the Tenoretic,,,,,,,,,,, one half tablet daily  Decrease the potassium one tablet daily  Stop the fenofibrate and continue the Crestor 10 mg Monday Wednesday Friday followup lipid panel in 6 weeks  If after monitoring your blood pressure for 4-6 weeks it remains normal,,,,,,,,,,,, 135/85,,,,,,,,,, continue a half a tablet daily  For the frequent urination I would consider starting by trying a caffeine free diet if that does not work or you would like a second opinion I would recommend just. Gaspar Garbe urologist

## 2012-03-03 ENCOUNTER — Telehealth: Payer: Self-pay | Admitting: *Deleted

## 2012-03-03 NOTE — Telephone Encounter (Signed)
Pt. Is confused on how to take Crestor, and when his next lab appt will be.  Please call [name] (his wife).

## 2012-03-03 NOTE — Telephone Encounter (Signed)
Spoke with wife

## 2012-03-09 ENCOUNTER — Encounter: Payer: 59 | Admitting: Family Medicine

## 2012-04-08 ENCOUNTER — Other Ambulatory Visit: Payer: 59

## 2012-04-22 ENCOUNTER — Other Ambulatory Visit (INDEPENDENT_AMBULATORY_CARE_PROVIDER_SITE_OTHER): Payer: 59

## 2012-04-22 DIAGNOSIS — E785 Hyperlipidemia, unspecified: Secondary | ICD-10-CM

## 2012-04-22 LAB — LIPID PANEL
Cholesterol: 160 mg/dL (ref 0–200)
VLDL: 33.4 mg/dL (ref 0.0–40.0)

## 2013-01-26 ENCOUNTER — Other Ambulatory Visit (INDEPENDENT_AMBULATORY_CARE_PROVIDER_SITE_OTHER): Payer: 59

## 2013-01-26 DIAGNOSIS — Z0000 Encounter for general adult medical examination without abnormal findings: Secondary | ICD-10-CM

## 2013-01-26 LAB — LIPID PANEL
Cholesterol: 166 mg/dL (ref 0–200)
Total CHOL/HDL Ratio: 4

## 2013-01-26 LAB — TSH: TSH: 1.68 u[IU]/mL (ref 0.35–5.50)

## 2013-01-26 LAB — POCT URINALYSIS DIPSTICK
Leukocytes, UA: NEGATIVE
Protein, UA: NEGATIVE
Urobilinogen, UA: 0.2
pH, UA: 5.5

## 2013-01-26 LAB — BASIC METABOLIC PANEL
CO2: 31 mEq/L (ref 19–32)
Calcium: 9.4 mg/dL (ref 8.4–10.5)
Glucose, Bld: 102 mg/dL — ABNORMAL HIGH (ref 70–99)
Potassium: 3.5 mEq/L (ref 3.5–5.1)
Sodium: 140 mEq/L (ref 135–145)

## 2013-01-26 LAB — CBC WITH DIFFERENTIAL/PLATELET
Basophils Absolute: 0 10*3/uL (ref 0.0–0.1)
Eosinophils Absolute: 0.3 10*3/uL (ref 0.0–0.7)
HCT: 45.1 % (ref 39.0–52.0)
Hemoglobin: 15.5 g/dL (ref 13.0–17.0)
Lymphs Abs: 1.6 10*3/uL (ref 0.7–4.0)
MCHC: 34.4 g/dL (ref 30.0–36.0)
MCV: 88 fl (ref 78.0–100.0)
Monocytes Absolute: 0.8 10*3/uL (ref 0.1–1.0)
Neutro Abs: 5.8 10*3/uL (ref 1.4–7.7)
RDW: 13.7 % (ref 11.5–14.6)

## 2013-01-26 LAB — HEPATIC FUNCTION PANEL: Albumin: 3.9 g/dL (ref 3.5–5.2)

## 2013-02-11 ENCOUNTER — Encounter: Payer: 59 | Admitting: Family Medicine

## 2013-03-18 ENCOUNTER — Encounter: Payer: Self-pay | Admitting: Family Medicine

## 2013-03-18 ENCOUNTER — Ambulatory Visit (INDEPENDENT_AMBULATORY_CARE_PROVIDER_SITE_OTHER): Payer: 59 | Admitting: Family Medicine

## 2013-03-18 VITALS — BP 120/86 | Temp 98.3°F | Ht 73.0 in | Wt 224.0 lb

## 2013-03-18 DIAGNOSIS — I10 Essential (primary) hypertension: Secondary | ICD-10-CM

## 2013-03-18 DIAGNOSIS — Z860100 Personal history of colon polyps, unspecified: Secondary | ICD-10-CM

## 2013-03-18 DIAGNOSIS — E291 Testicular hypofunction: Secondary | ICD-10-CM

## 2013-03-18 DIAGNOSIS — C6290 Malignant neoplasm of unspecified testis, unspecified whether descended or undescended: Secondary | ICD-10-CM

## 2013-03-18 DIAGNOSIS — J301 Allergic rhinitis due to pollen: Secondary | ICD-10-CM

## 2013-03-18 DIAGNOSIS — R7309 Other abnormal glucose: Secondary | ICD-10-CM

## 2013-03-18 DIAGNOSIS — L80 Vitiligo: Secondary | ICD-10-CM

## 2013-03-18 DIAGNOSIS — E785 Hyperlipidemia, unspecified: Secondary | ICD-10-CM

## 2013-03-18 MED ORDER — ATENOLOL-CHLORTHALIDONE 50-25 MG PO TABS: 1.0000 | ORAL_TABLET | Freq: Every day | ORAL | Status: DC

## 2013-03-18 MED ORDER — ROSUVASTATIN CALCIUM 10 MG PO TABS: 10.0000 mg | ORAL_TABLET | Freq: Every day | ORAL | Status: DC

## 2013-03-18 MED ORDER — POTASSIUM CHLORIDE CRYS ER 20 MEQ PO TBCR: EXTENDED_RELEASE_TABLET | ORAL | Status: DC

## 2013-03-18 NOTE — Patient Instructions (Signed)
Continue your current medications aspirin Tenoretic Crestor and potassium one of each daily  Return in one year for general physical examination sooner if any problems

## 2013-03-18 NOTE — Progress Notes (Signed)
  Subjective:    Patient ID: Brent Hansen, male    DOB: 12-06-1955, 57 y.o.   MRN: [id number]  HPI Brent Hansen is a 57 year old married male nonsmoker who comes in today for general physical examination because of a history of hypertension, hyperlipidemia, glucose intolerance  His medication reviewed the been no changes.  He gets routine eye care refer to Dr. Gweneth Hansen, regular dental care, colonoscopy and GI showed some polyps he is due for followup as outlined by GI     Review of Systems  Constitutional: Negative.   HENT: Negative.   Eyes: Negative.   Respiratory: Negative.   Cardiovascular: Negative.   Gastrointestinal: Negative.   Genitourinary: Negative.   Musculoskeletal: Negative.   Skin: Negative.   Neurological: Negative.   Psychiatric/Behavioral: Negative.        Objective:   Physical Exam  Constitutional: He is oriented to person, place, and time. He appears well-developed and well-nourished.  HENT:  Head: Normocephalic and atraumatic.  Right Ear: External ear normal.  Left Ear: External ear normal.  Nose: Nose normal.  Mouth/Throat: Oropharynx is clear and moist.  Eyes: Conjunctivae and EOM are normal. Pupils are equal, round, and reactive to light.  Neck: Normal range of motion. Neck supple. No JVD present. No tracheal deviation present. No thyromegaly present.  Cardiovascular: Normal rate, regular rhythm, normal heart sounds and intact distal pulses.  Exam reveals no gallop and no friction rub.   No murmur heard. Pulmonary/Chest: Effort normal and breath sounds normal. No stridor. No respiratory distress. He has no wheezes. He has no rales. He exhibits no tenderness.  Abdominal: Soft. Bowel sounds are normal. He exhibits no distension and no mass. There is no tenderness. There is no rebound and no guarding.  Genitourinary: Rectum normal, prostate normal and penis normal. Guaiac negative stool. No penile tenderness.  One testicle,,,,,,,,,,, other testicle removed  7 years ago testicular cancer  Musculoskeletal: Normal range of motion. He exhibits no edema and no tenderness.  Lymphadenopathy:    He has no cervical adenopathy.  Neurological: He is alert and oriented to person, place, and time. He has normal reflexes. No cranial nerve deficit. He exhibits normal muscle tone.  Skin: Skin is warm and dry. No rash noted. No erythema. No pallor.  Total body skin exam normal he does have some vitiligo  Psychiatric: He has a normal mood and affect. His behavior is normal. Judgment and thought content normal.          Assessment & Plan:  Healthy male  Hypertension at goal continue current therapy  Hyperlipidemia continue Crestor and aspirin  Return one year sooner if any problems

## 2013-04-20 ENCOUNTER — Encounter: Payer: 59 | Admitting: Family Medicine

## 2013-10-12 ENCOUNTER — Telehealth: Payer: Self-pay | Admitting: Family Medicine

## 2013-10-12 DIAGNOSIS — I10 Essential (primary) hypertension: Secondary | ICD-10-CM

## 2013-10-12 DIAGNOSIS — E785 Hyperlipidemia, unspecified: Secondary | ICD-10-CM

## 2013-10-12 DIAGNOSIS — E291 Testicular hypofunction: Secondary | ICD-10-CM

## 2013-10-12 NOTE — Telephone Encounter (Signed)
Pt scheduled for cpx with Dr. Tawanna Cooler on 12/21/13.  Requesting to have labs drawn at [facility] and also requesting vitamin D and C-Reactive Protein added to cpx labs.  Please enter lab order for patient to have labs done at [facility]

## 2013-10-13 ENCOUNTER — Ambulatory Visit (INDEPENDENT_AMBULATORY_CARE_PROVIDER_SITE_OTHER): Payer: 59 | Admitting: Family Medicine

## 2013-10-13 ENCOUNTER — Encounter: Payer: Self-pay | Admitting: Family Medicine

## 2013-10-13 VITALS — BP 120/80 | HR 60 | Temp 97.2°F | Wt 246.0 lb

## 2013-10-13 DIAGNOSIS — M546 Pain in thoracic spine: Secondary | ICD-10-CM

## 2013-10-13 MED ORDER — VALACYCLOVIR HCL 1 G PO TABS: 1000.0000 mg | ORAL_TABLET | Freq: Three times a day (TID) | ORAL | Status: DC

## 2013-10-13 NOTE — Patient Instructions (Signed)
Follow up promptly for any fever or worsening pain.

## 2013-10-13 NOTE — Telephone Encounter (Signed)
Labs ordered.

## 2013-10-13 NOTE — Progress Notes (Signed)
   Subjective:    Patient ID: Brent Hansen, male    DOB: [birth year], 57 y.o.   MRN: [id number]  HPI Acute visit for right mid thoracic back pain with radiation in somewhat of a dermatome distribution anterior. Onset Sunday. Did not notice any rash although here in office today we noted a couple small patches. No nausea or vomiting. No cough. No dyspnea. Pain has been mild to moderate. He took Aleve with some relief. He's had pain worse right back area more so than abdominal. No recent change in bowel habits.  Past Medical History  Diagnosis Date  . Hyperlipidemia   . Hypertension   . Cancer     testicular  . Vitiligo   . Hx of colonic polyps    Past Surgical History  Procedure Laterality Date  . Orchiectomy      reports that he has never smoked. He has never used smokeless tobacco. He reports that he drinks alcohol. He reports that he does not use illicit drugs. family history includes Diabetes in his brother, father, and another family member; Diverticulitis in his brother and mother; Heart attack in an other family member; Hyperlipidemia in an other family member; Hypertension in an other family member. No Known Allergies    Review of Systems  Constitutional: Negative for fever and chills.  Respiratory: Negative for cough and shortness of breath.   Cardiovascular: Negative for chest pain.  Gastrointestinal: Positive for abdominal pain.  Musculoskeletal: Positive for back pain.       Objective:   Physical Exam  Constitutional: He appears well-developed and well-nourished.  Cardiovascular: Normal rate and regular rhythm.   Pulmonary/Chest: Effort normal and breath sounds normal. No respiratory distress. He has no wheezes. He has no rales.  Abdominal: Soft. Bowel sounds are normal. He exhibits no distension and no mass. There is no tenderness. There is no rebound and no guarding.  Skin: Rash noted.  Patient has a couple of very small erythematous slightly raised areas of  rash in patch distribution one midline mid thoracic area another mid thoracic region lateral back          Assessment & Plan:  Thoracic back pain radiating anterior. Suspect early shingles. Has very nonspecific rash which may represent early shingles-and pain in dermatome type distribution. Start Valtrex 1 g 3 times a day for 7 days. Continue Aleve which is providing adequate pain relief. Followup promptly for fever or any new symptoms

## 2013-10-13 NOTE — Progress Notes (Signed)
Pre visit review using our clinic review tool, if applicable. No additional management support is needed unless otherwise documented below in the visit note. 

## 2013-10-22 ENCOUNTER — Telehealth: Payer: Self-pay | Admitting: Family Medicine

## 2013-10-22 MED ORDER — METHYLPREDNISOLONE 4 MG PO KIT: PACK | ORAL | Status: DC

## 2013-10-22 MED ORDER — GABAPENTIN 100 MG PO CAPS: 100.0000 mg | ORAL_CAPSULE | Freq: Three times a day (TID) | ORAL | Status: DC

## 2013-10-22 NOTE — Telephone Encounter (Signed)
Call in a Medrol dose pack and also Gabapentin 100 mg to take tid, call in #90 with no rf. He can follow up with Dr. Caryl Never next week if needed

## 2013-10-22 NOTE — Telephone Encounter (Signed)
Attempted to return call from [facility] regarding Dmari having abdominal pain.  No answer.  Left message on voicemail.

## 2013-10-22 NOTE — Telephone Encounter (Signed)
Patient Information:  Caller Name: [name]  Phone: [phone]  Patient: Brent Hansen, Brent Hansen  Gender: Male  DOB: January 26, 1956  Age: 57 Years  PCP: Kelle Darting [facility])  Office Follow Up:  Does the office need to follow up with this patient?: Yes  Instructions For The Office: Wife and patient are very concerned about on going shingles pain.  Working out, work and travel have been interupted.  He has completed Valtrex . He is continuing the aleve for pain and they are meeting his needs.  Appear to be frustrated.  Would like further guidance?  start Valtrex again?Marland Kitchen  When can he have the shingles immunization?  pharmacy [facility].  PLEASE CONTACT PATIENT AND WIFE. THEY ARE UPSET WITH AUTOMATED PHONE SYSTEM  RN Note:  Wife and patient are very concerned about on going shingles pain.  Working out, work and travel have been interupted.  He has completed Valtrex . He is continuing the aleve for pain and they are meeting his needs.  Appear to be frustrated.  Would like further guidance?  start Valtrex again?Marland Kitchen  When can he have the shingles immunization?  pharmacy [facility].  PLEASE CONTACT PATIENT AND WIFE. THEY ARE UPSET .  Symptoms  Reason For Call & Symptoms: Patinet in office on 10/13/13 for shingles he was placed on Valtrex and area are crusted over. No new areas.   He has rash  x3 areas.  One of the areas  is to the right of the navel..  Wife states they were  also having abdominal pain at the time.   She states they were told by Dr. Caryl Never that abdominal pain was normal with the shingles.  She is concerned that he is still hurting.  Patient to the phone- to describe the abdominal discomfort.  He denies Abdominal discomfort more "surface skin pain".  Located at the site . He has taken Aleve for the discomfort.  (Denies vomiting, no diarrhea, afebrile, able to walk without discomfort).   Valtrex has been completed. He is taking the Aleve.  He states he is not worse but is not  better. Is there something more they can do?  or there other treatments?  continue Valtrex. He cannot miss work and he awoke this morning with abdominal skin discomfort.  Reviewed Health History In EMR: Yes  Reviewed Medications In EMR: Yes  Reviewed Allergies In EMR: Yes  Reviewed Surgeries / Procedures: Yes  Date of Onset of Symptoms: 10/10/2013  Treatments Tried: Valtrex completed  Treatments Tried Worked: No  Guideline(s) Used:  Skin Lesion - Moles or Growths  Rash or Redness - Localized  Disposition Per Guideline:   See Today in Office  Reason For Disposition Reached:   Painful rash and has multiple small blisters grouped together in one area of body (i.e., dermatomal distribution or "band" or "stripe")  Advice Given:  Call Back If:  Fever occurs  You become worse.  RN Overrode Recommendation:  Follow Up With Office Later  Wife and patient are very concerned about on going shingles pain.  Working out, work and travel have been interupted.  He has completed Valtrex . He is continuing the aleve for pain and they are meeting his needs.  Appear to be frustrated.  Would like further guidance?  start Valtrex again?Marland Kitchen  When can he have the shingles immunization?  pharmacy [facility].  PLEASE CONTACT PATIENT AND WIFE. THEY ARE UPSET .

## 2013-10-22 NOTE — Telephone Encounter (Signed)
I spoke with pt and sent both scripts e-scribe. 

## 2013-10-22 NOTE — Telephone Encounter (Signed)
Seen Dr. Caryl Never on 10/13/13.  Please review and advise.  Thanks!

## 2013-10-22 NOTE — Telephone Encounter (Signed)
See other note

## 2013-12-20 ENCOUNTER — Ambulatory Visit: Payer: 59

## 2013-12-20 DIAGNOSIS — E291 Testicular hypofunction: Secondary | ICD-10-CM

## 2013-12-20 DIAGNOSIS — E785 Hyperlipidemia, unspecified: Secondary | ICD-10-CM

## 2013-12-20 DIAGNOSIS — I10 Essential (primary) hypertension: Secondary | ICD-10-CM

## 2013-12-20 LAB — CBC WITH DIFFERENTIAL/PLATELET
BASOS ABS: 0 10*3/uL (ref 0.0–0.1)
Basophils Relative: 0.6 % (ref 0.0–3.0)
EOS ABS: 0.3 10*3/uL (ref 0.0–0.7)
Eosinophils Relative: 4.2 % (ref 0.0–5.0)
HEMATOCRIT: 47.7 % (ref 39.0–52.0)
HEMOGLOBIN: 15.9 g/dL (ref 13.0–17.0)
Lymphocytes Relative: 23.9 % (ref 12.0–46.0)
Lymphs Abs: 1.8 10*3/uL (ref 0.7–4.0)
MCHC: 33.4 g/dL (ref 30.0–36.0)
MCV: 91.7 fl (ref 78.0–100.0)
Monocytes Absolute: 0.6 10*3/uL (ref 0.1–1.0)
Monocytes Relative: 8.2 % (ref 3.0–12.0)
NEUTROS ABS: 4.8 10*3/uL (ref 1.4–7.7)
Neutrophils Relative %: 63.1 % (ref 43.0–77.0)
PLATELETS: 205 10*3/uL (ref 150.0–400.0)
RBC: 5.2 Mil/uL (ref 4.22–5.81)
RDW: 14 % (ref 11.5–14.6)
WBC: 7.5 10*3/uL (ref 4.5–10.5)

## 2013-12-20 LAB — HEPATIC FUNCTION PANEL
ALK PHOS: 27 U/L — AB (ref 39–117)
ALT: 32 U/L (ref 0–53)
AST: 24 U/L (ref 0–37)
Albumin: 3.9 g/dL (ref 3.5–5.2)
BILIRUBIN DIRECT: 0.1 mg/dL (ref 0.0–0.3)
TOTAL PROTEIN: 6.7 g/dL (ref 6.0–8.3)
Total Bilirubin: 0.7 mg/dL (ref 0.3–1.2)

## 2013-12-20 LAB — URINALYSIS
Hgb urine dipstick: NEGATIVE
Ketones, ur: NEGATIVE
Leukocytes, UA: NEGATIVE
Nitrite: NEGATIVE
Specific Gravity, Urine: >=1.03 — AB (ref 1.000–1.030)
Total Protein, Urine: NEGATIVE
Urine Glucose: NEGATIVE
Urobilinogen, UA: 0.2 (ref 0.0–1.0)
pH: 6 (ref 5.0–8.0)

## 2013-12-20 LAB — LIPID PANEL
Cholesterol: 214 mg/dL — ABNORMAL HIGH (ref 0–200)
HDL: 35.9 mg/dL — ABNORMAL LOW (ref >39.00)
Total CHOL/HDL Ratio: 6
Triglycerides: 540 mg/dL — ABNORMAL HIGH (ref 0.0–149.0)
VLDL: 108 mg/dL — ABNORMAL HIGH (ref 0.0–40.0)

## 2013-12-20 LAB — BASIC METABOLIC PANEL
BUN: 12 mg/dL (ref 6–23)
CO2: 28 mEq/L (ref 19–32)
Calcium: 9.5 mg/dL (ref 8.4–10.5)
Chloride: 106 mEq/L (ref 96–112)
Creatinine, Ser: 1 mg/dL (ref 0.4–1.5)
GFR: 82.07 mL/min (ref >60.00)
Glucose, Bld: 116 mg/dL — ABNORMAL HIGH (ref 70–99)
Potassium: 4 mEq/L (ref 3.5–5.1)
Sodium: 141 mEq/L (ref 135–145)

## 2013-12-20 LAB — TSH: TSH: 2.02 u[IU]/mL (ref 0.35–5.50)

## 2013-12-20 LAB — LDL CHOLESTEROL, DIRECT: Direct LDL: 96.1 mg/dL

## 2013-12-20 LAB — PSA: PSA: 0.93 ng/mL (ref 0.10–4.00)

## 2013-12-20 LAB — C-REACTIVE PROTEIN: CRP: 2 mg/dL (ref 0.5–20.0)

## 2013-12-21 ENCOUNTER — Ambulatory Visit (INDEPENDENT_AMBULATORY_CARE_PROVIDER_SITE_OTHER): Payer: 59 | Admitting: Family Medicine

## 2013-12-21 ENCOUNTER — Encounter: Payer: Self-pay | Admitting: Family Medicine

## 2013-12-21 VITALS — BP 120/84 | Temp 98.2°F | Ht 72.5 in | Wt 244.0 lb

## 2013-12-21 DIAGNOSIS — L80 Vitiligo: Secondary | ICD-10-CM

## 2013-12-21 DIAGNOSIS — Z86010 Personal history of colonic polyps: Secondary | ICD-10-CM

## 2013-12-21 DIAGNOSIS — I10 Essential (primary) hypertension: Secondary | ICD-10-CM

## 2013-12-21 DIAGNOSIS — R7309 Other abnormal glucose: Secondary | ICD-10-CM

## 2013-12-21 DIAGNOSIS — J301 Allergic rhinitis due to pollen: Secondary | ICD-10-CM

## 2013-12-21 DIAGNOSIS — C6290 Malignant neoplasm of unspecified testis, unspecified whether descended or undescended: Secondary | ICD-10-CM

## 2013-12-21 DIAGNOSIS — E785 Hyperlipidemia, unspecified: Secondary | ICD-10-CM

## 2013-12-21 MED ORDER — ROSUVASTATIN CALCIUM 10 MG PO TABS: 10.0000 mg | ORAL_TABLET | Freq: Every day | ORAL | Status: DC

## 2013-12-21 MED ORDER — SILDENAFIL CITRATE 50 MG PO TABS: 50.0000 mg | ORAL_TABLET | ORAL | Status: DC | PRN

## 2013-12-21 MED ORDER — ATENOLOL-CHLORTHALIDONE 50-25 MG PO TABS: 1.0000 | ORAL_TABLET | Freq: Every day | ORAL | Status: DC

## 2013-12-21 MED ORDER — POTASSIUM CHLORIDE CRYS ER 20 MEQ PO TBCR: EXTENDED_RELEASE_TABLET | ORAL | Status: DC

## 2013-12-21 NOTE — Patient Instructions (Signed)
Continue your current medications  Continue exercise program  Viagra 50 mg....... one half to one tablet an hour to prior to sex,,,,,,, [email or web address]  Return in one year for general physical examination sooner if any problem  2 weeks prior to next years physical use 2 drops to the ear wax dissolving drops in each ear canal bedtime nightly

## 2013-12-21 NOTE — Progress Notes (Signed)
Pre visit review using our clinic review tool, if applicable. No additional management support is needed unless otherwise documented below in the visit note. 

## 2013-12-21 NOTE — Progress Notes (Signed)
   Subjective:    Patient ID: Brent Hansen, male    DOB: [birth year], 58 y.o.   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  Vaccinations up-to-date  He's working out with a Physiological scientist 3 days a week   Review of Systems  Constitutional: Negative.   HENT: Negative.   Eyes: Negative.   Respiratory: Negative.   Cardiovascular: Negative.   Gastrointestinal: Negative.   Endocrine: Negative.   Genitourinary: Negative.   Musculoskeletal: Negative.   Skin: Negative.   Allergic/Immunologic: Negative.   Neurological: Negative.   Hematological: Negative.   Psychiatric/Behavioral: Negative.        Objective:   Physical Exam  Nursing note and vitals reviewed. Constitutional: He is oriented to person, place, and time. He appears well-developed and well-nourished.  HENT:  Head: Normocephalic and atraumatic.  Right Ear: External ear normal.  Left Ear: External ear normal.  Nose: Nose normal.  Mouth/Throat: Oropharynx is clear and moist.  Bilateral cerumen impactions  Eyes: Conjunctivae and EOM are normal. Pupils are equal, round, and reactive to light.  Neck: Normal range of motion. Neck supple. No JVD present. No tracheal deviation present. No thyromegaly present.  Cardiovascular: Normal rate, regular rhythm, normal heart sounds and intact distal pulses.  Exam reveals no gallop and no friction rub.   No murmur heard. Pulmonary/Chest: Effort normal and breath sounds normal. No stridor. No respiratory distress. He has no wheezes. He has no rales. He exhibits no tenderness.  Abdominal:  Soft. Bowel sounds are normal. He exhibits no distension and no mass. There is no tenderness. There is no rebound and no guarding.  Genitourinary: Rectum normal, prostate normal and penis normal. Guaiac negative stool. No penile tenderness.  Left testicle surgically removed cancer 1213 years ago  Musculoskeletal: Normal range of motion. He exhibits no edema and no tenderness.  Lymphadenopathy:    He has no cervical adenopathy.  Neurological: He is alert and oriented to person, place, and time. He has normal reflexes. No cranial nerve deficit. He exhibits normal muscle tone.  Skin: Skin is warm and dry. No rash noted. No erythema. No pallor.  Total body skin exam normal  Psychiatric: He has a normal mood and affect. His behavior is normal. Judgment and thought content normal.          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

## 2013-12-22 ENCOUNTER — Telehealth: Payer: Self-pay | Admitting: Family Medicine

## 2013-12-22 NOTE — Telephone Encounter (Signed)
Relevant patient education assigned to patient using Emmi. ° °

## 2014-01-17 ENCOUNTER — Ambulatory Visit: Payer: 59 | Admitting: Family Medicine

## 2014-01-17 ENCOUNTER — Encounter: Payer: Self-pay | Admitting: Family Medicine

## 2014-01-17 ENCOUNTER — Ambulatory Visit (INDEPENDENT_AMBULATORY_CARE_PROVIDER_SITE_OTHER): Payer: 59 | Admitting: Family Medicine

## 2014-01-17 VITALS — BP 150/90 | Temp 97.6°F | Wt 240.0 lb

## 2014-01-17 DIAGNOSIS — J069 Acute upper respiratory infection, unspecified: Secondary | ICD-10-CM

## 2014-01-17 DIAGNOSIS — B9789 Other viral agents as the cause of diseases classified elsewhere: Principal | ICD-10-CM

## 2014-01-17 MED ORDER — HYDROCODONE-HOMATROPINE 5-1.5 MG/5ML PO SYRP: 5.0000 mL | ORAL_SOLUTION | Freq: Three times a day (TID) | ORAL | Status: DC | PRN

## 2014-01-17 NOTE — Patient Instructions (Signed)
Drink lots of water  Tylenol...... 2 tabs 3 times daily  Hydromet.......Marland Kitchen 1/2-1 teaspoon 3 times daily. For cough and cold  Return when necessary

## 2014-01-17 NOTE — Progress Notes (Signed)
   Subjective:    Patient ID: Brent Hansen, male    DOB: 09-21-1956, 58 y.o.   MRN: [id number]  HPI Tone is a 58 year old married male nonsmoker who comes in with a four-day history of head congestion sore throat and cough.   Review of Systems    review of systems negative Objective:   Physical Exam  Well-developed and nourished male no acute distress vital signs stable he is afebrile ENT negative neck was supple no adenopathy lungs are clear      Assessment & Plan:  Viral syndrome,,,,,,,,,, treat symptomatically

## 2014-01-17 NOTE — Progress Notes (Signed)
Pre visit review using our clinic review tool, if applicable. No additional management support is needed unless otherwise documented below in the visit note. 

## 2014-01-20 ENCOUNTER — Telehealth: Payer: Self-pay | Admitting: Family Medicine

## 2014-01-20 MED ORDER — AMOXICILLIN 875 MG PO TABS: 875.0000 mg | ORAL_TABLET | Freq: Two times a day (BID) | ORAL | Status: DC

## 2014-01-20 NOTE — Telephone Encounter (Signed)
Please advise 

## 2014-01-20 NOTE — Telephone Encounter (Signed)
Rx sent to pharmacy. Left message on machine for patient.  

## 2014-01-20 NOTE — Telephone Encounter (Signed)
Patient Information:  Caller Name: Rosann Auerbach  Phone: [phone]  Patient: Brent Hansen, Brent Hansen  Gender: Male  DOB: 08-24-1956  Age: 58 Years  PCP: Alysia Penna [facility])  Office Follow Up:  Does the office need to follow up with this patient?: Yes  Instructions For The Office: Seen 01/17/14, request abs.  RN Note:  Patient frustrated this am, doesn't want to come to office since he was just seen, request abx.  Advised triage will be sent for Md review, appt. offered.  [facility]  Symptoms  Reason For Call & Symptoms: Seen MD 01/17/14  for congestion, greenish nasal congestion, cough;  3/4 pm still feeling bad, started feeling a little nausea, head felt full, congested, felt worse if he was sitting up had to lay down for awhile;  Woke up at 0330 am with left ear "pounding", has a ringing or buzzing in it and this has continued;  Doesn't feel feverish.  Reviewed Health History In EMR: Yes  Reviewed Medications In EMR: Yes  Reviewed Allergies In EMR: Yes  Reviewed Surgeries / Procedures: Yes  Date of Onset of Symptoms: 01/20/2014  Treatments Tried: Hycodan  Treatments Tried Worked: No  Guideline(s) Used:  Sinus Pain and Congestion  Ear - Congestion  Cough  Earache  Disposition Per Guideline:   See Today in Office  Reason For Disposition Reached:   All other earaches (Exceptions: earache lasting < 1 hour, and earache from air travel)  Advice Given:  Pain Medicines:  For pain relief, you can take either acetaminophen, ibuprofen, or naproxen.  Acetaminophen (e.g., Tylenol):  Regular Strength Tylenol: Take 650 mg (two 325 mg pills) by mouth every 4-6 hours as needed. Each Regular Strength Tylenol pill has 325 mg of acetaminophen.  Extra Strength Tylenol: Take 1,000 mg (two 500 mg pills) every 8 hours as needed. Each Extra Strength Tylenol pill has 500 mg of acetaminophen.  The most you should take each day is 3,000 mg (10 Regular Strength or 6 Extra Strength pills a day).  Ibuprofen (e.g., Motrin, Advil):  Take 400 mg (two 200 mg pills) by mouth every 6 hours.  Another choice is to take 600 mg (three 200 mg pills) by mouth every 8 hours.  The most you should take each day is 1,200 mg (six 200 mg pills), unless your doctor has told you to take more.  Call Back If  You become worse.  Patient Refused Recommendation:  Patient Requests Prescription  Request abx.  Still with greenish nasal drainage, now with ear pain and ringing.

## 2014-01-22 ENCOUNTER — Encounter: Payer: Self-pay | Admitting: Family Medicine

## 2014-01-22 ENCOUNTER — Ambulatory Visit (INDEPENDENT_AMBULATORY_CARE_PROVIDER_SITE_OTHER): Payer: 59 | Admitting: Family Medicine

## 2014-01-22 VITALS — BP 130/90 | HR 62 | Temp 97.0°F | Ht 72.5 in | Wt 241.2 lb

## 2014-01-22 DIAGNOSIS — J209 Acute bronchitis, unspecified: Secondary | ICD-10-CM | POA: Insufficient documentation

## 2014-01-22 DIAGNOSIS — I10 Essential (primary) hypertension: Secondary | ICD-10-CM

## 2014-01-22 DIAGNOSIS — H60399 Other infective otitis externa, unspecified ear: Secondary | ICD-10-CM | POA: Insufficient documentation

## 2014-01-22 MED ORDER — NEOMYCIN-POLYMYXIN-HC 3.5-10000-1 OT SOLN: 3.0000 [drp] | Freq: Three times a day (TID) | OTIC | Status: DC

## 2014-01-22 MED ORDER — CEFDINIR 300 MG PO CAPS
300.0000 mg | ORAL_CAPSULE | Freq: Two times a day (BID) | ORAL | Status: AC
Start: 2014-01-22 — End: 2014-02-01

## 2014-01-22 NOTE — Assessment & Plan Note (Signed)
Well controlled despite recent illness. 

## 2014-01-22 NOTE — Assessment & Plan Note (Signed)
Symptoms worsening switched to Cefdinir and encouraged probiotics

## 2014-01-22 NOTE — Progress Notes (Signed)
Patient ID: Brent Hansen, male   DOB: 08-10-1956, 58 y.o.   MRN: [id number] Brent Hansen [id number] Nov 04, 1956 01/22/2014      Progress Note-Follow Up  Subjective  Chief Complaint  Chief Complaint  Patient presents with  . Ear Fullness    left ear stopped up since Thursday.   . Nasal Congestion    & cough x 8 days now. Currently on Amoxicillin    HPI  Is in today accompanied by his wife. He has been feeling poorly all weekend if symptoms are worsening. He was kept her up last night by cough. He's complaining of tinnitus, decreased hearing and pain in his left ear. Struggling with symptoms for roughly a had significant nasal congestion and green rhinorrhea. Has a cough as well the headache. Cough syrup hasn't helped him to sleep at night. Denies any chest pain or sore throat. Has been struggling with some anorexia and nausea as well the last few days. No improvement noted since starting amoxicillin  Past Medical History  Diagnosis Date  . Hyperlipidemia   . Hypertension   . Cancer     testicular  . Vitiligo   . Hx of colonic polyps     Past Surgical History  Procedure Laterality Date  . Orchiectomy      Family History  Problem Relation Age of Onset  . Heart attack      family hx  . Hypertension      family hx  . Hyperlipidemia      family hx  . Diabetes      family hx  . Diverticulitis Mother   . Diabetes Father   . Diverticulitis Brother   . Diabetes Brother     History   Social History  . Marital Status: Married    Spouse Name: N/A    Number of Children: N/A  . Years of Education: N/A   Occupational History  . Not on file.   Social History Main Topics  . Smoking status: Never Smoker   . Smokeless tobacco: Never Used  . Alcohol Use: Yes  . Drug Use: No  . Sexual Activity: Not on file   Other Topics Concern  . Not on file   Social History Narrative  . No narrative on file    Current Outpatient Prescriptions on File Prior to Visit   Medication Sig Dispense Refill  . amoxicillin (AMOXIL) 875 MG tablet Take 1 tablet (875 mg total) by mouth 2 (two) times daily.  14 tablet  0  . aspirin 81 MG tablet Take 81 mg by mouth daily.        Marland Kitchen atenolol-chlorthalidone (TENORETIC) 50-25 MG per tablet Take 1 tablet by mouth daily.  100 tablet  3  . HYDROcodone-homatropine (HYCODAN) 5-1.5 MG/5ML syrup Take 5 mLs by mouth every 8 (eight) hours as needed for cough.  240 mL  0  . potassium chloride SA (KLOR-CON M20) 20 MEQ tablet 1 tablet daily  100 tablet  3  . rosuvastatin (CRESTOR) 10 MG tablet Take 1 tablet (10 mg total) by mouth daily.  100 tablet  3  . sildenafil (VIAGRA) 50 MG tablet Take 1 tablet (50 mg total) by mouth as needed for erectile dysfunction.  10 tablet  11   No current facility-administered medications on file prior to visit.    No Known Allergies  Review of Systems  Review of Systems  Constitutional: Negative for fever and malaise/fatigue.  HENT: Negative for congestion.   Eyes: Negative for  discharge.  Respiratory: Negative for shortness of breath.   Cardiovascular: Negative for chest pain, palpitations and leg swelling.  Gastrointestinal: Negative for nausea, abdominal pain and diarrhea.  Genitourinary: Negative for dysuria.  Musculoskeletal: Negative for falls.  Skin: Negative for rash.  Neurological: Negative for loss of consciousness and headaches.  Endo/Heme/Allergies: Negative for polydipsia.  Psychiatric/Behavioral: Negative for depression and suicidal ideas. The patient is not nervous/anxious and does not have insomnia.     Objective  BP 130/90  Pulse 62  Temp(Src) 97 F (36.1 C) (Oral)  Ht 6' 0.5" (1.842 m)  Wt 241 lb 4 oz (109.43 kg)  BMI 32.25 kg/m2  SpO2 97%  Physical Exam  Physical Exam  Lab Results  Component Value Date   TSH 2.02 12/20/2013   Lab Results  Component Value Date   WBC 7.5 12/20/2013   HGB 15.9 12/20/2013   HCT 47.7 12/20/2013   MCV 91.7 12/20/2013   PLT 205.0  12/20/2013   Lab Results  Component Value Date   CREATININE 1.0 12/20/2013   BUN 12 12/20/2013   NA 141 12/20/2013   K 4.0 12/20/2013   CL 106 12/20/2013   CO2 28 12/20/2013   Lab Results  Component Value Date   ALT 32 12/20/2013   AST 24 12/20/2013   ALKPHOS 27* 12/20/2013   BILITOT 0.7 12/20/2013   Lab Results  Component Value Date   CHOL 214* 12/20/2013   Lab Results  Component Value Date   HDL 35.90* 12/20/2013   Lab Results  Component Value Date   LDLCALC 82 04/22/2012   Lab Results  Component Value Date   TRIG 540.0* 12/20/2013   Lab Results  Component Value Date   CHOLHDL 6 12/20/2013     Assessment & Plan  HYPERTENSION Well controlled despite recent illness  Otitis, externa, infective Cortisporin otic prescribed Left ear  Acute bronchitis Symptoms worsening switched to Cefdinir and encouraged probiotics

## 2014-01-22 NOTE — Patient Instructions (Signed)
Probiotic daily such as Digestive Advantage or a generic daily   Otitis Externa Otitis externa is a bacterial or fungal infection of the outer ear canal. This is the area from the eardrum to the outside of the ear. Otitis externa is sometimes called "swimmer's ear." CAUSES  Possible causes of infection include:  Swimming in dirty water.  Moisture remaining in the ear after swimming or bathing.  Mild injury (trauma) to the ear.  Objects stuck in the ear (foreign body).  Cuts or scrapes (abrasions) on the outside of the ear. SYMPTOMS  The first symptom of infection is often itching in the ear canal. Later signs and symptoms may include swelling and redness of the ear canal, ear pain, and yellowish-Frankl fluid (pus) coming from the ear. The ear pain may be worse when pulling on the earlobe. DIAGNOSIS  Your caregiver will perform a physical exam. A sample of fluid may be taken from the ear and examined for bacteria or fungi. TREATMENT  Antibiotic ear drops are often given for 10 to 14 days. Treatment may also include pain medicine or corticosteroids to reduce itching and swelling. PREVENTION   Keep your ear dry. Use the corner of a towel to absorb water out of the ear canal after swimming or bathing.  Avoid scratching or putting objects inside your ear. This can damage the ear canal or remove the protective wax that lines the canal. This makes it easier for bacteria and fungi to grow.  Avoid swimming in lakes, polluted water, or poorly chlorinated pools.  You may use ear drops made of rubbing alcohol and vinegar after swimming. Combine equal parts of [place] vinegar and alcohol in a bottle. Put 3 or 4 drops into each ear after swimming. HOME CARE INSTRUCTIONS   Apply antibiotic ear drops to the ear canal as prescribed by your caregiver.  Only take over-the-counter or prescription medicines for pain, discomfort, or fever as directed by your caregiver.  If you have diabetes, follow any  additional treatment instructions from your caregiver.  Keep all follow-up appointments as directed by your caregiver. SEEK MEDICAL CARE IF:   You have a fever.  Your ear is still red, swollen, painful, or draining pus after 3 days.  Your redness, swelling, or pain gets worse.  You have a severe headache.  You have redness, swelling, pain, or tenderness in the area behind your ear. MAKE SURE YOU:   Understand these instructions.  Will watch your condition.  Will get help right away if you are not doing well or get worse. Document Released: 11/04/2005 Document Revised: 01/27/2012 Document Reviewed: 11/21/2011 [facility] Patient Information 2014 [place]. Bronchitis Bronchitis is inflammation of the airways that extend from the windpipe into the lungs (bronchi). The inflammation often causes mucus to develop, which leads to a cough. If the inflammation becomes severe, it may cause shortness of breath. CAUSES  Bronchitis may be caused by:   Viral infections.   Bacteria.   Cigarette smoke.   Allergens, pollutants, and other irritants.  SIGNS AND SYMPTOMS  The most common symptom of bronchitis is a frequent cough that produces mucus. Other symptoms include:  Fever.   Body aches.   Chest congestion.   Chills.   Shortness of breath.   Sore throat.  DIAGNOSIS  Bronchitis is usually diagnosed through a medical history and physical exam. Tests, such as chest X-rays, are sometimes done to rule out other conditions.  TREATMENT  You may need to avoid contact with whatever caused the  problem (smoking, for example). Medicines are sometimes needed. These may include:  Antibiotics. These may be prescribed if the condition is caused by bacteria.  Cough suppressants. These may be prescribed for relief of cough symptoms.   Inhaled medicines. These may be prescribed to help open your airways and make it easier for you to breathe.   Steroid medicines. These may  be prescribed for those with recurrent (chronic) bronchitis. HOME CARE INSTRUCTIONS  Get plenty of rest.   Drink enough fluids to keep your urine clear or pale yellow (unless you have a medical condition that requires fluid restriction). Increasing fluids may help thin your secretions and will prevent dehydration.   Only take over-the-counter or prescription medicines as directed by your health care provider.  Only take antibiotics as directed. Make sure you finish them even if you start to feel better.  Avoid secondhand smoke, irritating chemicals, and strong fumes. These will make bronchitis worse. If you are a smoker, quit smoking. Consider using nicotine gum or skin patches to help control withdrawal symptoms. Quitting smoking will help your lungs heal faster.   Put a cool-mist humidifier in your bedroom at night to moisten the air. This may help loosen mucus. Change the water in the humidifier daily. You can also run the hot water in your shower and sit in the bathroom with the door closed for 5 10 minutes.   Follow up with your health care provider as directed.   Wash your hands frequently to avoid catching bronchitis again or spreading an infection to others.  SEEK MEDICAL CARE IF: Your symptoms do not improve after 1 week of treatment.  SEEK IMMEDIATE MEDICAL CARE IF:  Your fever increases.  You have chills.   You have chest pain.   You have worsening shortness of breath.   You have bloody sputum.  You faint.  You have lightheadedness.  You have a severe headache.   You vomit repeatedly. MAKE SURE YOU:   Understand these instructions.  Will watch your condition.  Will get help right away if you are not doing well or get worse. Document Released: 11/04/2005 Document Revised: 08/25/2013 Document Reviewed: 06/29/2013  [facility] Patient Information 2014 [place].

## 2014-01-22 NOTE — Assessment & Plan Note (Signed)
Cortisporin otic prescribed Left ear

## 2014-01-22 NOTE — Progress Notes (Signed)
Pre-visit discussion using our clinic review tool. No additional management support is needed unless otherwise documented below in the visit note.  

## 2014-01-26 ENCOUNTER — Ambulatory Visit (INDEPENDENT_AMBULATORY_CARE_PROVIDER_SITE_OTHER): Payer: 59 | Admitting: Physician Assistant

## 2014-01-26 ENCOUNTER — Encounter: Payer: Self-pay | Admitting: Physician Assistant

## 2014-01-26 ENCOUNTER — Other Ambulatory Visit: Payer: Self-pay | Admitting: Physician Assistant

## 2014-01-26 ENCOUNTER — Encounter (HOSPITAL_BASED_OUTPATIENT_CLINIC_OR_DEPARTMENT_OTHER): Payer: Self-pay

## 2014-01-26 ENCOUNTER — Telehealth: Payer: Self-pay | Admitting: Family Medicine

## 2014-01-26 ENCOUNTER — Ambulatory Visit (HOSPITAL_BASED_OUTPATIENT_CLINIC_OR_DEPARTMENT_OTHER)
Admission: RE | Admit: 2014-01-26 | Discharge: 2014-01-26 | Disposition: A | Discharge status: Home / Self Care | Payer: 59 | Source: Ambulatory Visit | Attending: Physician Assistant | Admitting: Physician Assistant

## 2014-01-26 VITALS — BP 116/80 | HR 71 | Temp 98.2°F | Resp 16 | Ht 72.5 in | Wt 241.1 lb

## 2014-01-26 DIAGNOSIS — R51 Headache: Secondary | ICD-10-CM

## 2014-01-26 DIAGNOSIS — H5509 Other forms of nystagmus: Secondary | ICD-10-CM

## 2014-01-26 DIAGNOSIS — R519 Headache, unspecified: Secondary | ICD-10-CM | POA: Insufficient documentation

## 2014-01-26 DIAGNOSIS — J329 Chronic sinusitis, unspecified: Secondary | ICD-10-CM

## 2014-01-26 MED ORDER — IOHEXOL 300 MG/ML  SOLN
100.0000 mL | Freq: Once | INTRAMUSCULAR | Status: AC | PRN
  Administered 2014-01-26: 100 mL via INTRAVENOUS

## 2014-01-26 MED ORDER — FLUTICASONE PROPIONATE 50 MCG/ACT NA SUSP: 2.0000 | Freq: Every day | NASAL | Status: DC

## 2014-01-26 NOTE — Assessment & Plan Note (Signed)
Due to severity and abrupt onset, as well as, new onset resting nystagmus, will proceed with CT Head.  Continue care as discussed at visit to include antibiotics for sinusitis. Will arrange follow-up and/or referral once results are in.  Patient instructed to proceed directly to ER if symptoms recur. Patient and wife voice understanding.

## 2014-01-26 NOTE — Progress Notes (Signed)
Pre visit review using our clinic review tool, if applicable. No additional management support is needed unless otherwise documented below in the visit note/SLS  

## 2014-01-26 NOTE — Telephone Encounter (Signed)
Called and left message for patient to return call.  

## 2014-01-26 NOTE — Progress Notes (Signed)
Patient presents to clinic today c/o headache and sinus/head pressure.  Patient was seen on 01/22/14 at Saturday clinic and was diagnosed with sinusitis and otitis externa.  Was given prescription for Cefdinir and Ciprodex. Patient endorses that these symptoms have improved.  Still has sensation of ear fullness.  Patient denies fever, chills, sweats, ear pain or tooth pain.  Is coming in today because he had a severe headache, describes as one of the worst headaches of his life.  Denies photophobia, phonophobia, LH or dizziness.  Denies N/V.  Denies facial drooping, AMS or extremity weakness.  Patient denies hx of MI or CVA.  Has history of HTN.  Takes medications as prescribed. BP is normotensive at today's visit.  Patient denies tinnitus.  Past Medical History  Diagnosis Date  . Hyperlipidemia   . Hypertension   . Cancer     testicular  . Vitiligo   . Hx of colonic polyps     Current Outpatient Prescriptions on File Prior to Visit  Medication Sig Dispense Refill  . aspirin 81 MG tablet Take 81 mg by mouth daily.        Marland Kitchen atenolol-chlorthalidone (TENORETIC) 50-25 MG per tablet Take 1 tablet by mouth daily.  100 tablet  3  . cefdinir (OMNICEF) 300 MG capsule Take 1 capsule (300 mg total) by mouth 2 (two) times daily.  20 capsule  0  . HYDROcodone-homatropine (HYCODAN) 5-1.5 MG/5ML syrup Take 5 mLs by mouth every 8 (eight) hours as needed for cough.  240 mL  0  . neomycin-polymyxin-hydrocortisone (CORTISPORIN) otic solution Place 3 drops into the left ear 3 (three) times daily.  10 mL  0  . potassium chloride SA (KLOR-CON M20) 20 MEQ tablet 1 tablet daily  100 tablet  3  . sildenafil (VIAGRA) 50 MG tablet Take 1 tablet (50 mg total) by mouth as needed for erectile dysfunction.  10 tablet  11   No current facility-administered medications on file prior to visit.    No Known Allergies  Family History  Problem Relation Age of Onset  . Heart attack      family hx  . Hypertension      family  hx  . Hyperlipidemia      family hx  . Diabetes      family hx  . Diverticulitis Mother   . Diabetes Father   . Diverticulitis Brother   . Diabetes Brother     History   Social History  . Marital Status: Married    Spouse Name: N/A    Number of Children: N/A  . Years of Education: N/A   Social History Main Topics  . Smoking status: Never Smoker   . Smokeless tobacco: Never Used  . Alcohol Use: Yes  . Drug Use: No  . Sexual Activity: None   Other Topics Concern  . None   Social History Narrative  . None    Review of Systems - See HPI.  All other ROS are negative.  BP 116/80  Pulse 71  Temp(Src) 98.2 F (36.8 C) (Oral)  Resp 16  Ht 6' 0.5" (1.842 m)  Wt 241 lb 2 oz (109.374 kg)  BMI 32.24 kg/m2  SpO2 96%  Physical Exam  Vitals reviewed. Constitutional: He is oriented to person, place, and time and well-developed, well-nourished, and in no distress.  HENT:  Head: Normocephalic and atraumatic.  Right Ear: Tympanic membrane, external ear and ear canal normal.  Left Ear: Tympanic membrane, external ear and ear canal  normal.  Nose: Nose normal. Right sinus exhibits no maxillary sinus tenderness and no frontal sinus tenderness. Left sinus exhibits no maxillary sinus tenderness and no frontal sinus tenderness.  Mouth/Throat: Uvula is midline, oropharynx is clear and moist and mucous membranes are normal. No oropharyngeal exudate, posterior oropharyngeal edema, posterior oropharyngeal erythema or tonsillar abscesses.  Eyes: Conjunctivae are normal. Pupils are equal, round, and reactive to light. Right eye exhibits nystagmus. Left eye exhibits nystagmus.  Jerk nystagmus noted at rest.  Nystagmus is horizontal with fast phase left-beating.  Patient denies hx of nystagmus.  Not noted at previous visits.  Neck: Neck supple.  Cardiovascular: Normal rate, regular rhythm, normal heart sounds and intact distal pulses.   Pulmonary/Chest: Effort normal and breath sounds normal.  No respiratory distress. He has no wheezes. He has no rales. He exhibits no tenderness.  Abdominal: Soft. Bowel sounds are normal. He exhibits no distension and no mass. There is no tenderness. There is no rebound and no guarding.  Neurological: He is alert and oriented to person, place, and time. No cranial nerve deficit. Gait normal. Coordination normal. GCS score is 15.  Skin: Skin is warm and dry. No rash noted.  Psychiatric: Affect normal.    Recent Results (from the past 2160 hour(s))  BASIC METABOLIC PANEL     Status: Abnormal   Collection Time    12/20/13  7:47 AM      Result Value Ref Range   Sodium 141  135 - 145 mEq/L   Potassium 4.0  3.5 - 5.1 mEq/L   Chloride 106  96 - 112 mEq/L   CO2 28  19 - 32 mEq/L   Glucose, Bld 116 (*) 70 - 99 mg/dL   BUN 12  6 - 23 mg/dL   Creatinine, Ser 1.0  0.4 - 1.5 mg/dL   Calcium 9.5  8.4 - 10.5 mg/dL   GFR 82.07  >60.00 mL/min  LIPID PANEL     Status: Abnormal   Collection Time    12/20/13  7:47 AM      Result Value Ref Range   Cholesterol 214 (*) 0 - 200 mg/dL   Comment: ATP III Classification       Desirable:  < 200 mg/dL               Borderline High:  200 - 239 mg/dL          High:  > = 240 mg/dL   Triglycerides 540.0 (*) 0.0 - 149.0 mg/dL   Comment: Normal:  <150 mg/dLBorderline High:  150 - 199 mg/dLTriglyceride is over 400; calculations on Lipids are invalid.   HDL 35.90 (*) >39.00 mg/dL   VLDL 108.0 (*) 0.0 - 40.0 mg/dL   Total CHOL/HDL Ratio 6     Comment:                Men          Women1/2 Average Risk     3.4          3.3Average Risk          5.0          4.42X Average Risk          9.6          7.13X Average Risk          15.0          11.0  HEPATIC FUNCTION PANEL     Status: Abnormal   Collection Time    12/20/13  7:47 AM      Result Value Ref Range   Total Bilirubin 0.7  0.3 - 1.2 mg/dL   Bilirubin, Direct 0.1  0.0 - 0.3 mg/dL   Alkaline Phosphatase 27 (*) 39 - 117 U/L   AST 24  0 - 37 U/L   ALT  32  0 - 53 U/L   Total Protein 6.7  6.0 - 8.3 g/dL   Albumin 3.9  3.5 - 5.2 g/dL  PSA     Status: None   Collection Time    12/20/13  7:47 AM      Result Value Ref Range   PSA 0.93  0.10 - 4.00 ng/mL  URINALYSIS     Status: Abnormal   Collection Time    12/20/13  7:47 AM      Result Value Ref Range   Color, Urine YELLOW  Yellow;Lt. Yellow   APPearance CLEAR  Clear   Specific Gravity, Urine >=1.030 (*) 1.000 - 1.030   pH 6.0  5.0 - 8.0   Total Protein, Urine NEGATIVE  Negative   Urine Glucose NEGATIVE  Negative   Ketones, ur NEGATIVE  Negative   Bilirubin Urine SMALL (*) Negative   Hgb urine dipstick NEGATIVE  Negative   Urobilinogen, UA 0.2  0.0 - 1.0   Leukocytes, UA NEGATIVE  Negative   Nitrite NEGATIVE  Negative  TSH     Status: None   Collection Time    12/20/13  7:47 AM      Result Value Ref Range   TSH 2.02  0.35 - 5.50 uIU/mL  CBC WITH DIFFERENTIAL     Status: None   Collection Time    12/20/13  7:47 AM      Result Value Ref Range   WBC 7.5  4.5 - 10.5 K/uL   RBC 5.20  4.22 - 5.81 Mil/uL   Hemoglobin 15.9  13.0 - 17.0 g/dL   HCT 47.7  39.0 - 52.0 %   MCV 91.7  78.0 - 100.0 fl   MCHC 33.4  30.0 - 36.0 g/dL   RDW 14.0  11.5 - 14.6 %   Platelets 205.0  150.0 - 400.0 K/uL   Neutrophils Relative % 63.1  43.0 - 77.0 %   Lymphocytes Relative 23.9  12.0 - 46.0 %   Monocytes Relative 8.2  3.0 - 12.0 %   Eosinophils Relative 4.2  0.0 - 5.0 %   Basophils Relative 0.6  0.0 - 3.0 %   Neutro Abs 4.8  1.4 - 7.7 K/uL   Lymphs Abs 1.8  0.7 - 4.0 K/uL   Monocytes Absolute 0.6  0.1 - 1.0 K/uL   Eosinophils Absolute 0.3  0.0 - 0.7 K/uL   Basophils Absolute 0.0  0.0 - 0.1 K/uL  LDL CHOLESTEROL, DIRECT     Status: None   Collection Time    12/20/13  7:47 AM      Result Value Ref Range   Direct LDL 96.1     Comment: Optimal:  <100 mg/dLNear or Above Optimal:  100-129 mg/dLBorderline High:  130-159 mg/dLHigh:  160-189 mg/dLVery High:  >190 mg/dL  C-REACTIVE PROTEIN     Status:  None   Collection Time    12/20/13  9:29 AM      Result Value Ref Range   CRP 2.0  0.5 - 20.0 mg/dL    Assessment/Plan: Jerk nystagmus Horizontal.  New onset.  Associated with severe headache of sudden onset.  Continue medication regimen for sinusitis.  Add Flonase.  Will obtain CT head to rule out cranial pathology.  Sudden onset of severe headache Due to severity and abrupt onset, as well as, new onset resting nystagmus, will proceed with CT Head.  Continue care as discussed at visit to include antibiotics for sinusitis. Will arrange follow-up and/or referral once results are in.  Patient instructed to proceed directly to ER if symptoms recur. Patient and wife voice understanding.

## 2014-01-26 NOTE — Assessment & Plan Note (Signed)
Horizontal.  New onset.  Associated with severe headache of sudden onset.  Continue medication regimen for sinusitis.  Add Flonase.  Will obtain CT head to rule out cranial pathology.

## 2014-01-26 NOTE — Telephone Encounter (Signed)
Patient Information:  Caller Name: Tomi Bamberger  Phone: [phone]  Patient: Brent Hansen, Brent Hansen  Gender: Male  DOB: [birth year]  Age: 58 Years  PCP: Stevie Kern [facility])  Office Follow Up:  Does the office need to follow up with this patient?: Yes  Instructions For The Office: Please call for an appointment or recommendations.  RN Note:  Pt came home from work with a moderate to severe headache. He describes the headache as moderate at present. On [place] for bronchitis since 01/22/14. Cough has improved but the headache is a new sx.  Symptoms  Reason For Call & Symptoms: Headache  Reviewed Health History In EMR: Yes  Reviewed Medications In EMR: Yes  Reviewed Allergies In EMR: Yes  Reviewed Surgeries / Procedures: Yes  Date of Onset of Symptoms: 01/26/2014  Treatments Tried: Aleve  Treatments Tried Worked: No  Guideline(s) Used:  Headache  Disposition Per Guideline:   See Today or Tomorrow in Office  Reason For Disposition Reached:   New headache and age > 49  Advice Given:  Rest:   Lie down in a dark, quiet place and try to relax. Close your eyes and imagine your entire body relaxing.  Apply Cold to the Area:   Apply a cold wet washcloth or cold pack to the forehead for 20 minutes.  Call Back If:  You become worse.  Call Back If:  Headache lasts longer than 24 hours  You become worse.  Patient Will Follow Care Advice:  YES

## 2014-01-26 NOTE — Patient Instructions (Signed)
Please continue medications as prescribed.  Increase fluids.  Rest. Continue care as discussed at previous visit.  Please stop by the front desk to speak with Marj.  She is trying to set up your CT scan.  If you have a recurrent severe headache or if you develop severe nausea, vomiting or altered mental status please call 911 or go to the ER.  I will call you as soon as I have your results.

## 2014-01-27 NOTE — Telephone Encounter (Signed)
Left message to call office, saw pt was seen by Raiford Noble PA and CT scan done.

## 2014-02-14 ENCOUNTER — Encounter: Payer: Self-pay | Admitting: Family Medicine

## 2014-02-14 ENCOUNTER — Ambulatory Visit (INDEPENDENT_AMBULATORY_CARE_PROVIDER_SITE_OTHER): Payer: 59 | Admitting: Family Medicine

## 2014-02-14 VITALS — BP 124/72 | HR 76 | Temp 98.0°F | Wt 242.0 lb

## 2014-02-14 DIAGNOSIS — R202 Paresthesia of skin: Secondary | ICD-10-CM

## 2014-02-14 DIAGNOSIS — R209 Unspecified disturbances of skin sensation: Secondary | ICD-10-CM

## 2014-02-14 NOTE — Telephone Encounter (Signed)
Patient Information:  Caller Name: Brent Hansen  Phone: [phone]  Patient: Brent Hansen, Brent Hansen  Gender: Male  DOB: 04-05-1956  Age: 58 Years  PCP: Stevie Kern [facility])  Office Follow Up:  Does the office need to follow up with this patient?: No  Instructions For The Office: N/A  RN Note:  Afebrile. Mucinex for the last 2 days for URI, "head cold". Onset 02/13/2014 of Left hand, index finger, tingling (left bicep intermittently, on that arm, feels soreness after working out with a trainer) but can still use well. Please call him back to schedule an appointment for the next 72 hours. RN/CAN called office back and asked to be worked in since no appointments available in EPIC in any office. Scheduler worked him in with Dr. Maudie Mercury tomorrow 02/15/2014 @ 8:00, he agreed.   Symptoms  Reason For Call & Symptoms: Left hand index finger tingling  Reviewed Health History In EMR: Yes  Reviewed Medications In EMR: Yes  Reviewed Allergies In EMR: Yes  Reviewed Surgeries / Procedures: Yes  Date of Onset of Symptoms: 02/13/2014  Guideline(s) Used:  Neurologic Deficit  No Protocol Available - Sick Adult  Disposition Per Guideline:   See Within 3 Days in Office  Reason For Disposition Reached:   Nursing judgment  Advice Given:  N/A  Patient Will Follow Care Advice:  YES

## 2014-02-14 NOTE — Patient Instructions (Signed)

## 2014-02-14 NOTE — Progress Notes (Signed)
   Subjective:    Patient ID: Brent Hansen, male    DOB: [birth year], 58 y.o.   MRN: [id number]  HPI Patient seen with tingling left hand involving index finger, middle finger, and thumb. Noted a couple weeks ago.  Denies any upper extremity weakness. No neck pain. No arm or forearm involvement. No history of carpal tunnel syndrome. No clear exacerbating factors. He spends a fair amount of time typing on keyboard. Denies any other areas of tingling or numbness. No alleviating factors.  Past Medical History  Diagnosis Date  . Hyperlipidemia   . Hypertension   . Vitiligo   . Hx of colonic polyps   . Cancer     testicular   Past Surgical History  Procedure Laterality Date  . Orchiectomy      reports that he has never smoked. He has never used smokeless tobacco. He reports that he drinks alcohol. He reports that he does not use illicit drugs. family history includes Diabetes in his brother, father, and another family member; Diverticulitis in his brother and mother; Heart attack in an other family member; Hyperlipidemia in an other family member; Hypertension in an other family member. No Known Allergies    Review of Systems  Constitutional: Negative for fever and chills.  Cardiovascular: Negative for chest pain.  Musculoskeletal: Negative for neck pain.  Neurological: Positive for numbness. Negative for weakness.  Hematological: Negative for adenopathy.       Objective:   Physical Exam  Constitutional: He appears well-developed and well-nourished.  Cardiovascular: Normal rate.   Pulmonary/Chest: Effort normal and breath sounds normal. No respiratory distress. He has no wheezes.  Musculoskeletal: He exhibits no edema.  Good capillary refill in left hand. Normal radial and ulnar pulses. Full range of motion left hand wrist elbow and shoulder. Full range of motion neck with lateral bending and rotation  Neurological:  Symmetric upper extremity reflexes. Full-strength. No muscle  atrophy.          Assessment & Plan:  Paresthesias involving left hand thumb, index, and middle finger. Suspect carpal tunnel syndrome. Recommend right wrist splint. Touch base in 3 weeks if not improving

## 2014-02-14 NOTE — Progress Notes (Signed)
Pre visit review using our clinic review tool, if applicable. No additional management support is needed unless otherwise documented below in the visit note. 

## 2014-02-15 ENCOUNTER — Ambulatory Visit: Payer: 59 | Admitting: Family Medicine

## 2014-02-21 ENCOUNTER — Telehealth: Payer: Self-pay | Admitting: Family Medicine

## 2014-02-21 NOTE — Telephone Encounter (Signed)
Spoke with patient and advised him to make an appointment for a prescription

## 2014-02-21 NOTE — Telephone Encounter (Signed)
Patient Information:  Caller Name: Rosann Auerbach  Phone: [phone]  Patient: Brent Hansen, Brent Hansen  Gender: Male  DOB: 1956-05-10  Age: 58 Years  PCP: Carolann Littler [facility])  Office Follow Up:  Does the office need to follow up with this patient?: Yes  Instructions For The Office: Patient is  requesting a prescription for "ear tenderness" . No drainage from ear, no pain down in the ear, but it is sensitive to touch on the outer ear and pain with sleeping on that side.  Pharmacy [facility].  Reviewed EPIC. Numerous visits recently for issues. Declined appt. Please have physician review.  Contact patient regarding request  RN Note:  Patient is  requesting a prescription for "ear tenderness" . No drainage from ear, no pain down in the ear, but it is sensitive to touch on the outer ear and pain with sleeping on that side.  Pharmacy [facility].  Reviewed EPIC. Numerous visits recently for issues. Declined appt. Please have physician review.  Contact patient regarding request  Symptoms  Reason For Call & Symptoms: Wifes states her husband has been sick since February and has seen by Dr. Rosalita Chessman, cody Hassell Done , Dr. Sarajane Jews  x4 visits and CT scan.  He was treated for  Brochitis and ear infection. He was referred to  ENT for ear and sinus issues.  He has completed antibiotics in March 02/08/14.   Onset Friday 02/18/14 nasal  congestion, [place] drainage along with right ear pain yesterday . Marland Kitchen They are requesting antibiotic.  Patient to the phone , he denies pain in the ear but it is  sensitive with touching the inside.  Afebrile. He has not been swimming. No drainage.  Reviewed Health History In EMR: Yes  Reviewed Medications In EMR: Yes  Reviewed Allergies In EMR: Yes  Reviewed Surgeries / Procedures: Yes  Date of Onset of Symptoms: 02/18/2014  Guideline(s) Used:  Earache  Ear - Swimmer's - Otitis Externa  Disposition Per Guideline:   See Today or Tomorrow in Office  Reason For Disposition  Reached:   Diagnosis is uncertain  Advice Given:  Pain Medicines:  For pain relief, you can take either acetaminophen, ibuprofen, or naproxen.  They are over-the-counter (OTC) pain drugs. You can buy them at the drugstore.  Acetaminophen (e.g., Tylenol):  Regular Strength Tylenol: Take 650 mg (two 325 mg pills) by mouth every 4-6 hours as needed. Each Regular Strength Tylenol pill has 325 mg of acetaminophen.  Ibuprofen (e.g., Motrin, Advil):  Take 400 mg (two 200 mg pills) by mouth every 6 hours.  Call Back If:  Ear symptoms last longer than 7 days with treatment  You become worse.  Patient Refused Recommendation:  Patient Requests Prescription  Patient is  requesting a prescription for "ear tenderness" . No drainage from ear, no pain down in the ear, but it is sensitive to touch on the outer ear and pain with sleeping on that side.  Pharmacy [facility].  Reviewed EPIC. Numerous visits recently for issues. Declined appt. Please have physician review.  Contact patient regarding request

## 2014-03-30 ENCOUNTER — Encounter: Payer: Self-pay | Admitting: Family Medicine

## 2014-03-30 ENCOUNTER — Ambulatory Visit: Payer: 59 | Admitting: Physician Assistant

## 2014-03-30 ENCOUNTER — Ambulatory Visit (INDEPENDENT_AMBULATORY_CARE_PROVIDER_SITE_OTHER): Payer: 59 | Admitting: Family Medicine

## 2014-03-30 VITALS — BP 165/92 | HR 92 | Temp 98.9°F

## 2014-03-30 DIAGNOSIS — S81811A Laceration without foreign body, right lower leg, initial encounter: Secondary | ICD-10-CM

## 2014-03-30 DIAGNOSIS — S81009A Unspecified open wound, unspecified knee, initial encounter: Secondary | ICD-10-CM

## 2014-03-30 DIAGNOSIS — S91009A Unspecified open wound, unspecified ankle, initial encounter: Secondary | ICD-10-CM

## 2014-03-30 DIAGNOSIS — S81809A Unspecified open wound, unspecified lower leg, initial encounter: Secondary | ICD-10-CM

## 2014-03-30 NOTE — Progress Notes (Signed)
Pre visit review using our clinic review tool, if applicable. No additional management support is needed unless otherwise documented below in the visit note. 

## 2014-03-30 NOTE — Progress Notes (Signed)
   Subjective:    Patient ID: Brent Hansen, male    DOB: November 25, 1955, 58 y.o.   MRN: [id number]  HPI Here for a laceration to the right lower leg which occurred at home on 03-26-14. He was walking in his attic when his right foot went through the sheet rock floor next to a metal pipe, and an edge of the pipe cut into the leg. There was not much bleeding, but over the past few days the area has become red and swollen and very painful. No fever.    Review of Systems  Constitutional: Negative.        Objective:   Physical Exam  Constitutional:  Limping, in pain  Skin:  The lateral right lower leg has a deep 2 cm laceration down to the muscle layers. The entire area ia red, warm, swollen and very tender          Assessment & Plan:  He has a cellulitis around a laceration which may require deeper irrigation or a surgical procedure. We will send him to [facility] this morning.

## 2014-04-04 ENCOUNTER — Other Ambulatory Visit: Payer: Self-pay | Admitting: Family Medicine

## 2014-05-16 ENCOUNTER — Other Ambulatory Visit: Payer: Self-pay | Admitting: Family Medicine

## 2014-07-18 ENCOUNTER — Other Ambulatory Visit: Payer: Self-pay | Admitting: Family Medicine

## 2014-09-06 ENCOUNTER — Other Ambulatory Visit: Payer: Self-pay | Admitting: Family Medicine

## 2014-09-12 ENCOUNTER — Other Ambulatory Visit: Payer: Self-pay | Admitting: Family Medicine

## 2014-10-03 ENCOUNTER — Other Ambulatory Visit: Payer: Self-pay | Admitting: Family Medicine

## 2014-11-22 ENCOUNTER — Telehealth: Payer: Self-pay | Admitting: Family Medicine

## 2014-11-22 DIAGNOSIS — E785 Hyperlipidemia, unspecified: Secondary | ICD-10-CM

## 2014-11-22 DIAGNOSIS — I10 Essential (primary) hypertension: Secondary | ICD-10-CM

## 2014-11-22 NOTE — Telephone Encounter (Signed)
Due to pt's work scheduled, he prefers to go to [facility] for his labs. Can you put the order in? thanks!

## 2014-11-22 NOTE — Telephone Encounter (Signed)
Labs ordered.

## 2014-11-30 ENCOUNTER — Other Ambulatory Visit: Payer: Self-pay | Admitting: Family Medicine

## 2014-12-20 ENCOUNTER — Other Ambulatory Visit (INDEPENDENT_AMBULATORY_CARE_PROVIDER_SITE_OTHER): Payer: 59

## 2014-12-20 DIAGNOSIS — I10 Essential (primary) hypertension: Secondary | ICD-10-CM | POA: Diagnosis not present

## 2014-12-20 DIAGNOSIS — N401 Enlarged prostate with lower urinary tract symptoms: Secondary | ICD-10-CM | POA: Diagnosis not present

## 2014-12-20 DIAGNOSIS — E785 Hyperlipidemia, unspecified: Secondary | ICD-10-CM

## 2014-12-20 LAB — LIPID PANEL
CHOL/HDL RATIO: 3
CHOLESTEROL: 149 mg/dL (ref 0–200)
HDL: 49.4 mg/dL (ref >39.00)
LDL Cholesterol: 77 mg/dL (ref 0–99)
NonHDL: 99.6
TRIGLYCERIDES: 115 mg/dL (ref 0.0–149.0)
VLDL: 23 mg/dL (ref 0.0–40.0)

## 2014-12-20 LAB — HEPATIC FUNCTION PANEL
ALBUMIN: 4.4 g/dL (ref 3.5–5.2)
ALT: 34 U/L (ref 0–53)
AST: 26 U/L (ref 0–37)
Alkaline Phosphatase: 31 U/L — ABNORMAL LOW (ref 39–117)
Bilirubin, Direct: 0.1 mg/dL (ref 0.0–0.3)
Total Bilirubin: 0.6 mg/dL (ref 0.2–1.2)
Total Protein: 6.7 g/dL (ref 6.0–8.3)

## 2014-12-20 LAB — CBC WITH DIFFERENTIAL/PLATELET
Basophils Absolute: 0 10*3/uL (ref 0.0–0.1)
Basophils Relative: 0.5 % (ref 0.0–3.0)
EOS PCT: 5 % (ref 0.0–5.0)
Eosinophils Absolute: 0.4 10*3/uL (ref 0.0–0.7)
HEMATOCRIT: 44.7 % (ref 39.0–52.0)
HEMOGLOBIN: 15.1 g/dL (ref 13.0–17.0)
Lymphocytes Relative: 14.5 % (ref 12.0–46.0)
Lymphs Abs: 1.1 10*3/uL (ref 0.7–4.0)
MCHC: 33.9 g/dL (ref 30.0–36.0)
MCV: 88.2 fl (ref 78.0–100.0)
MONO ABS: 0.5 10*3/uL (ref 0.1–1.0)
Monocytes Relative: 6.5 % (ref 3.0–12.0)
NEUTROS ABS: 5.6 10*3/uL (ref 1.4–7.7)
NEUTROS PCT: 73.5 % (ref 43.0–77.0)
Platelets: 179 10*3/uL (ref 150.0–400.0)
RBC: 5.07 Mil/uL (ref 4.22–5.81)
RDW: 13.7 % (ref 11.5–15.5)
WBC: 7.6 10*3/uL (ref 4.0–10.5)

## 2014-12-20 LAB — URINALYSIS
BILIRUBIN URINE: NEGATIVE
Hgb urine dipstick: NEGATIVE
KETONES UR: NEGATIVE
Leukocytes, UA: NEGATIVE
NITRITE: NEGATIVE
PH: 7 (ref 5.0–8.0)
Specific Gravity, Urine: 1.015 (ref 1.000–1.030)
TOTAL PROTEIN, URINE-UPE24: NEGATIVE
Urine Glucose: NEGATIVE
Urobilinogen, UA: 1 (ref 0.0–1.0)

## 2014-12-20 LAB — BASIC METABOLIC PANEL
BUN: 20 mg/dL (ref 6–23)
CALCIUM: 9.8 mg/dL (ref 8.4–10.5)
CO2: 31 meq/L (ref 19–32)
Chloride: 103 mEq/L (ref 96–112)
Creatinine, Ser: 1.04 mg/dL (ref 0.40–1.50)
GFR: 78.16 mL/min (ref >60.00)
GLUCOSE: 88 mg/dL (ref 70–99)
POTASSIUM: 4.7 meq/L (ref 3.5–5.1)
SODIUM: 139 meq/L (ref 135–145)

## 2014-12-20 LAB — PSA: PSA: 0.66 ng/mL (ref 0.10–4.00)

## 2014-12-20 LAB — TSH: TSH: 2.44 u[IU]/mL (ref 0.35–4.50)

## 2014-12-26 ENCOUNTER — Ambulatory Visit (INDEPENDENT_AMBULATORY_CARE_PROVIDER_SITE_OTHER): Payer: 59 | Admitting: Family Medicine

## 2014-12-26 ENCOUNTER — Encounter: Payer: Self-pay | Admitting: Family Medicine

## 2014-12-26 VITALS — BP 120/80 | Temp 98.1°F | Ht 72.5 in | Wt 212.0 lb

## 2014-12-26 DIAGNOSIS — L80 Vitiligo: Secondary | ICD-10-CM

## 2014-12-26 DIAGNOSIS — N401 Enlarged prostate with lower urinary tract symptoms: Secondary | ICD-10-CM

## 2014-12-26 DIAGNOSIS — I10 Essential (primary) hypertension: Secondary | ICD-10-CM

## 2014-12-26 DIAGNOSIS — R351 Nocturia: Secondary | ICD-10-CM

## 2014-12-26 DIAGNOSIS — E785 Hyperlipidemia, unspecified: Secondary | ICD-10-CM

## 2014-12-26 MED ORDER — FINASTERIDE 5 MG PO TABS: 5.0000 mg | ORAL_TABLET | Freq: Every day | ORAL | Status: DC

## 2014-12-26 MED ORDER — ROSUVASTATIN CALCIUM 10 MG PO TABS: 10.0000 mg | ORAL_TABLET | Freq: Every day | ORAL | Status: DC

## 2014-12-26 MED ORDER — ATENOLOL-CHLORTHALIDONE 50-25 MG PO TABS: 1.0000 | ORAL_TABLET | Freq: Every day | ORAL | Status: DC

## 2014-12-26 NOTE — Progress Notes (Signed)
   Subjective:    Patient ID: Brent Hansen, male    DOB: 1956-04-19, 59 y.o.   MRN: [id number]  HPI Brent Hansen is a 59 year old married male nonsmoker who comes in today for general physical examination  14 years ago he felt a lump in his left testicle it turned out to be a malignancy. He had it removed he's done well and be released.  In the last couple months he's noticed nocturia 2-3 decreased stream and dribbling.  He takes Tenoretic 50-25 daily for hypertension and aspirin tablet he's lost 30 pounds with diet and exercise BP 120/80  He takes Crestor 10 mg Monday Wednesday Friday and lipids are dramatically improved since she's lost weight in his exercise.  He gets routine eye care, dental care, colonoscopy by Dr. Earlean Shawl was normal on the last exam. Previous exam showed some polyps.  Vaccinations up-to-date by Brent Hansen     Review of Systems  Constitutional: Negative.   HENT: Negative.   Eyes: Negative.   Respiratory: Negative.   Cardiovascular: Negative.   Gastrointestinal: Negative.   Endocrine: Negative.   Genitourinary: Negative.   Musculoskeletal: Negative.   Skin: Negative.   Allergic/Immunologic: Negative.   Neurological: Negative.   Hematological: Negative.   Psychiatric/Behavioral: Negative.        Objective:   Physical Exam  Constitutional: He is oriented to person, place, and time. He appears well-developed and well-nourished.  HENT:  Head: Normocephalic and atraumatic.  Right Ear: External ear normal.  Left Ear: External ear normal.  Nose: Nose normal.  Mouth/Throat: Oropharynx is clear and moist.  Eyes: Conjunctivae and EOM are normal. Pupils are equal, round, and reactive to light.  Neck: Normal range of motion. Neck supple. No JVD present. No tracheal deviation present. No thyromegaly present.  Cardiovascular: Normal rate, regular rhythm, normal heart sounds and intact distal pulses.  Exam reveals no gallop and no friction rub.   No murmur heard. No  carotid nor aortic bruits peripheral pulses 2+ and symmetrical  Pulmonary/Chest: Effort normal and breath sounds normal. No stridor. No respiratory distress. He has no wheezes. He has no rales. He exhibits no tenderness.  Abdominal: Soft. Bowel sounds are normal. He exhibits no distension and no mass. There is no tenderness. There is no rebound and no guarding.  Genitourinary: Rectum normal and penis normal. Guaiac negative stool. No penile tenderness.  Scar left groin from previous surgery left testicle removed 14 years ago for cancer.  Right testicle normal  Prostate 1+ symmetrical BPH  Musculoskeletal: Normal range of motion. He exhibits no edema or tenderness.  Lymphadenopathy:    He has no cervical adenopathy.  Neurological: He is alert and oriented to person, place, and time. He has normal reflexes. No cranial nerve deficit. He exhibits normal muscle tone.  Skin: Skin is warm and dry. No rash noted. No erythema. No pallor.  Total body skin exam except vitiligo  Psychiatric: He has a normal mood and affect. His behavior is normal. Judgment and thought content normal.  Nursing note and vitals reviewed.         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

## 2014-12-26 NOTE — Patient Instructions (Signed)
Tenoretic 50 25.......... one half tab daily in the morning for high blood pressure........ BP check daily for 4 weeks......Marland Kitchen blood pressure goal 135/85 or less  Crestor 10 mg........... stop the Crestor.........  Finasteride 5 mg.......... one tablet daily at bedtime...Marland KitchenMarland KitchenMarland Kitchen return in May for follow-up..... When you return for follow-up on that issue will check a lipid panel you may not need the Crestor anymore  Continue diet His Prograf

## 2014-12-26 NOTE — Progress Notes (Signed)
Pre visit review using our clinic review tool, if applicable. No additional management support is needed unless otherwise documented below in the visit note. 

## 2015-01-14 IMAGING — CT CT HEAD WO/W CM
1 of 2 series · 13 of 30 positions shown, 17 images · non-contrast
Comparison: none

[Series 2: head w/ cm 4.8 h37s · axial · 0.48mm/px · z∈[-140,+6]mm · 13 of 36 slices shown, 17 images]
[im 3/36  brain]
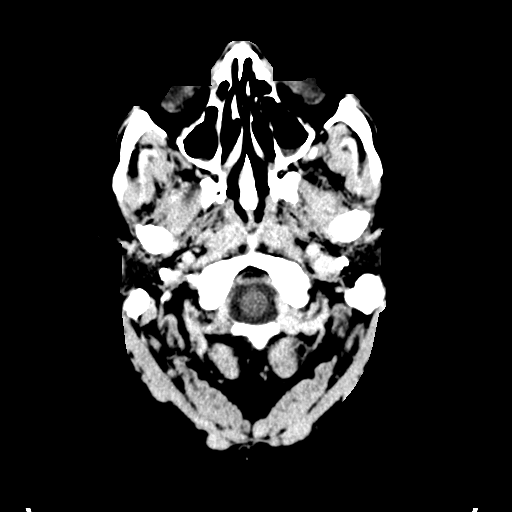
[im 3/36  bone]
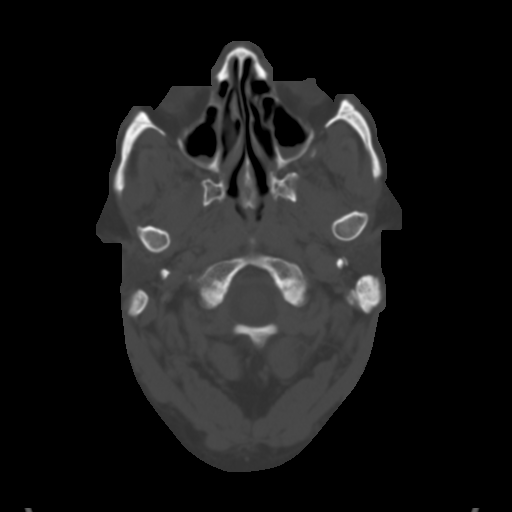
[im 6/36  brain]
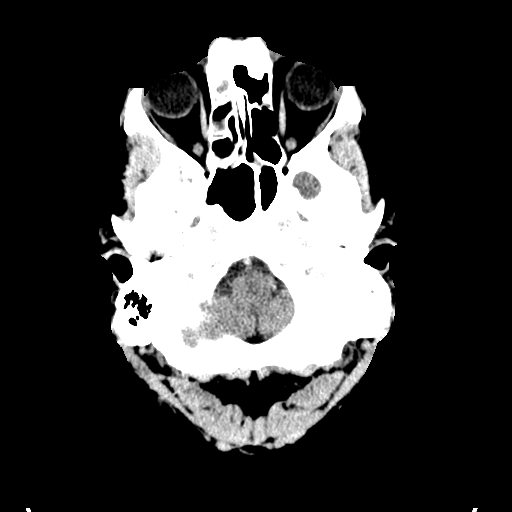
[im 8/36  brain]
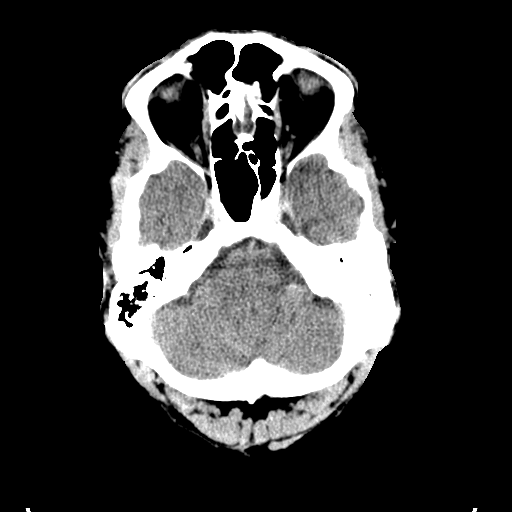
[im 11/36  brain]
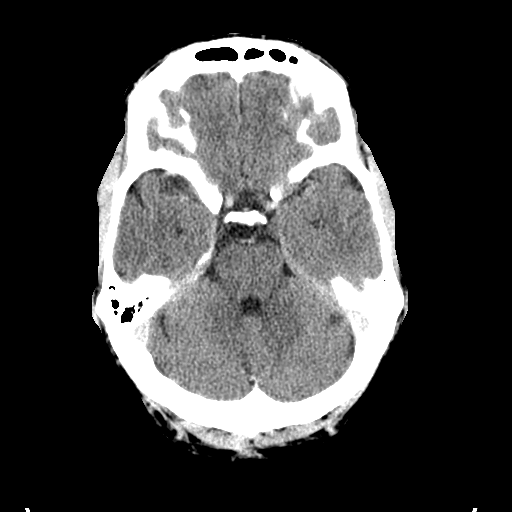
[im 13/36  brain]
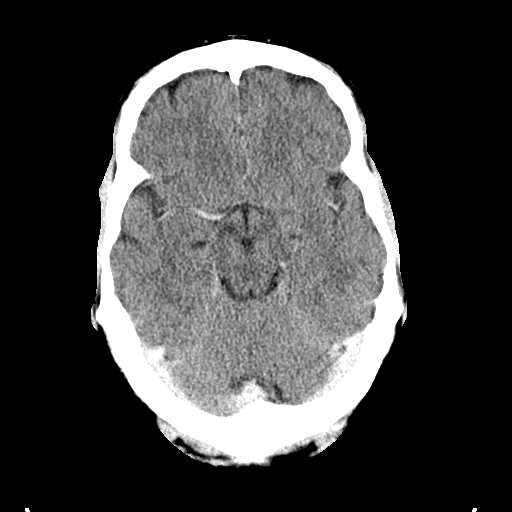
[im 13/36  bone]
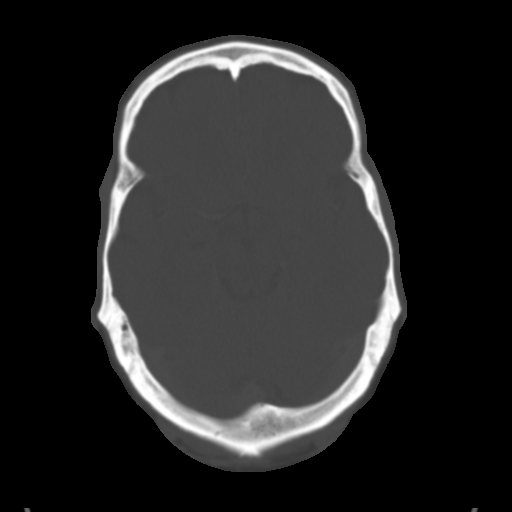
[im 16/36  brain]
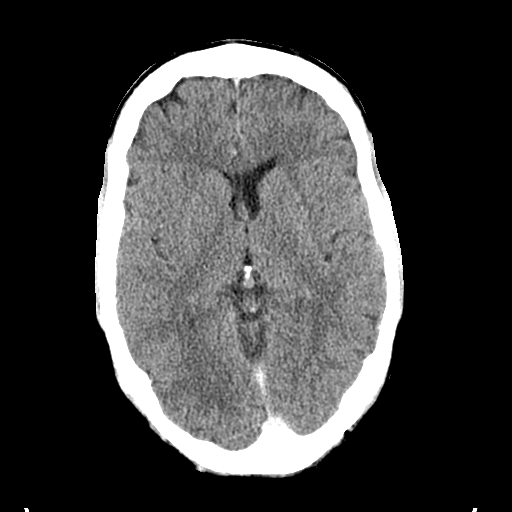
[im 18/36  brain]
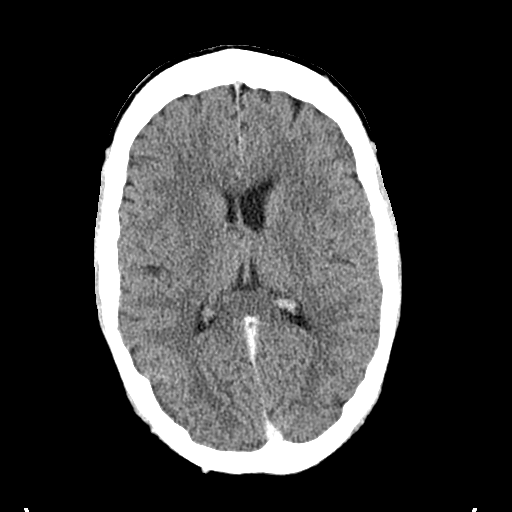
[im 21/36  brain]
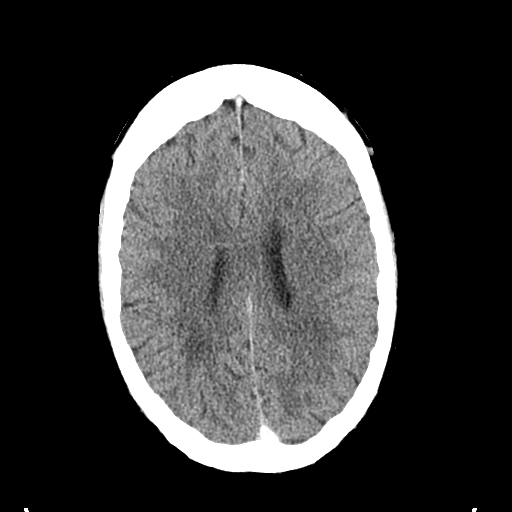
[im 23/36  brain]
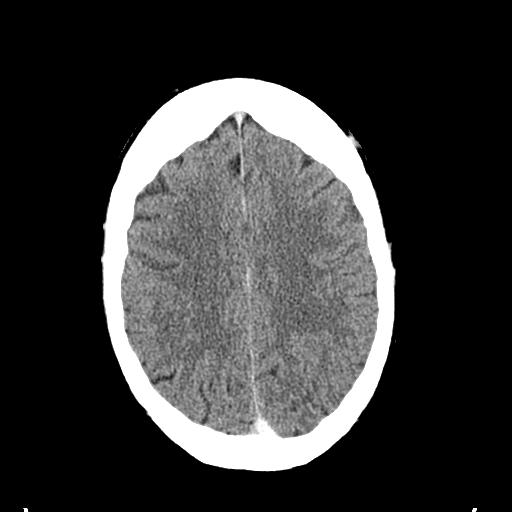
[im 23/36  bone]
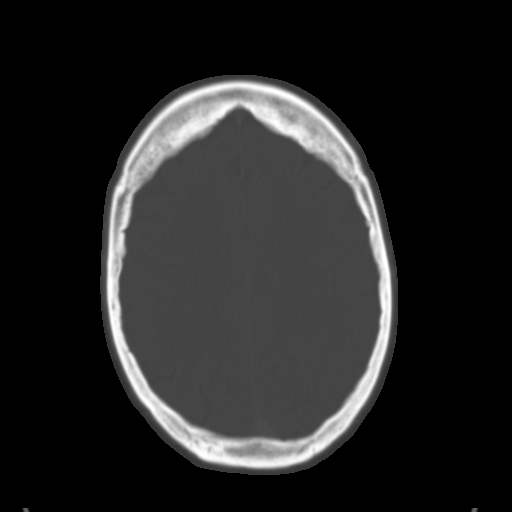
[im 26/36  brain]
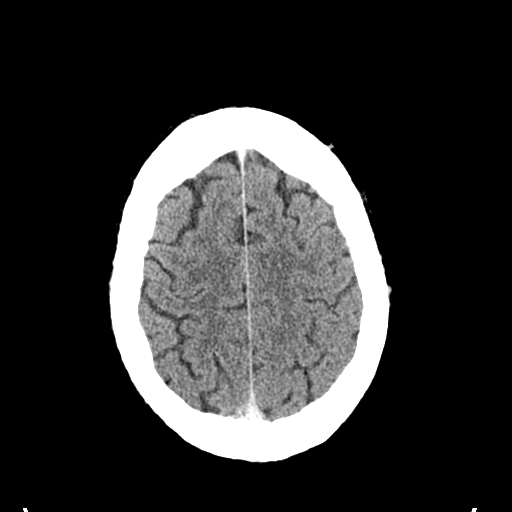
[im 28/36  brain]
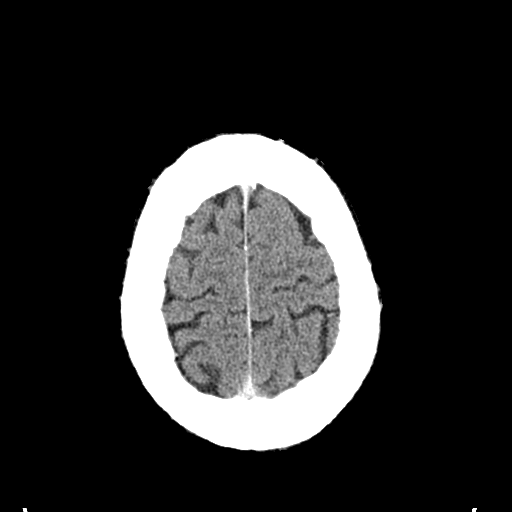
[im 31/36  brain]
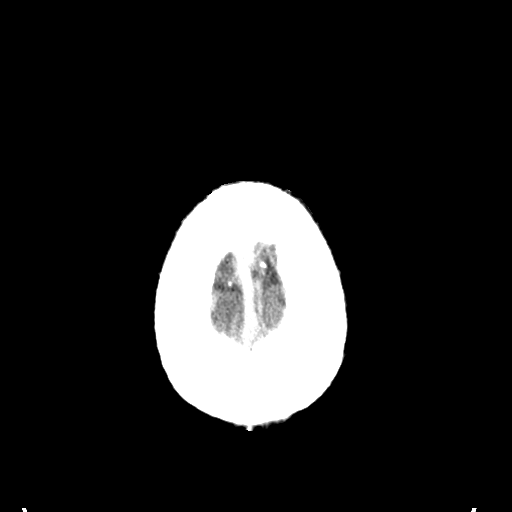
[im 33/36  brain]
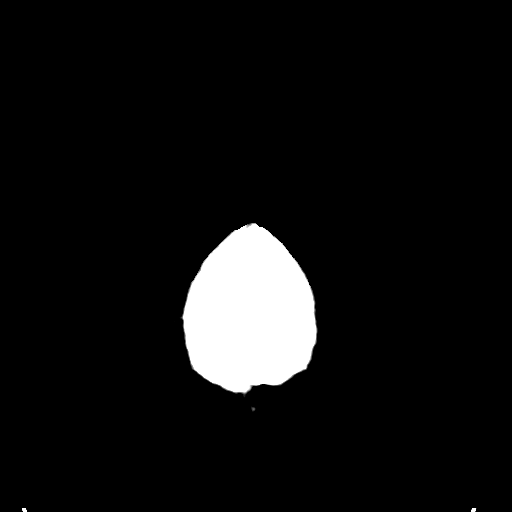
[im 33/36  bone]
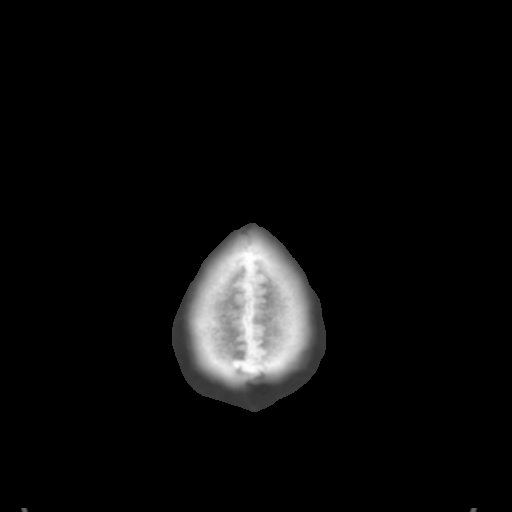

[13 of 30 positions shown; findings below may reference images not displayed]

CLINICAL DATA
Nystagmus at rest; rule out mass

EXAM
CT HEAD WITHOUT AND WITH CONTRAST

TECHNIQUE
Contiguous axial images were obtained from the base of the skull
through the vertex without and with intravenous contrast

CONTRAST
100mL OMNIPAQUE IOHEXOL 300 MG/ML  SOLN

COMPARISON
None.

FINDINGS
There is no evidence of mass effect, midline shift or extra-axial
fluid collections. There is no evidence of a space-occupying lesion
or intracranial hemorrhage. There is no evidence of a cortical-based
area of acute infarction. There is a small area of low attenuation
adjacent to the posterior right lateral ventricle within the white
matter likely representing an area of microangiopathy. There are no
areas of abnormal enhancement. There is an old left cerebellar
infarct.

The ventricles and sulci are appropriate for the patient's age. The
basal cisterns are patent.

Visualized portions of the orbits are unremarkable. There are
bilateral small maxillary sinus air-fluid levels. There is bilateral
ethmoid sinus mucosal thickening. There is mild bilateral sphenoid
sinus mucosal thickening.

The osseous structures are unremarkable.

IMPRESSION
1. No acute intracranial pathology.
2. No areas of abnormal intracranial enhancement.
3. Sinus disease as described above.

SIGNATURE

## 2015-03-27 ENCOUNTER — Ambulatory Visit: Payer: 59 | Admitting: Family Medicine

## 2015-04-04 ENCOUNTER — Encounter: Payer: Self-pay | Admitting: Family Medicine

## 2015-04-04 ENCOUNTER — Ambulatory Visit (INDEPENDENT_AMBULATORY_CARE_PROVIDER_SITE_OTHER): Payer: 59 | Admitting: Family Medicine

## 2015-04-04 VITALS — BP 110/70 | Temp 98.2°F | Wt 198.0 lb

## 2015-04-04 DIAGNOSIS — E785 Hyperlipidemia, unspecified: Secondary | ICD-10-CM | POA: Diagnosis not present

## 2015-04-04 DIAGNOSIS — I10 Essential (primary) hypertension: Secondary | ICD-10-CM | POA: Diagnosis not present

## 2015-04-04 MED ORDER — ATENOLOL 25 MG PO TABS: 12.5000 mg | ORAL_TABLET | Freq: Every day | ORAL | Status: DC

## 2015-04-04 NOTE — Progress Notes (Signed)
   Subjective:    Patient ID: Brent Hansen, male    DOB: Mar 20, 1956, 59 y.o.   MRN: [id number]  HPI Brent Hansen is a 59 year old married male nonsmoker who comes in for evaluation of 3 issues  He was on a cholesterol-lowering drug Crestor Monday Wednesday Friday and his lipids are normal. He started diet and exercise program and we therefore stopped his Crestor 3 months ago to see if indeed he could maintain normal lipids off medication. We'll get a fasting lipid panel next week to validate that  He's on Proscar 5 mg daily because of a history of BPH. He's not satisfied with his urine. He would like a second opinion. Refer to Dr. less boredom  He takes Tenoretic 50-25 one half tab daily for hypertension BP 110/70. We discussed decreasing the beta blocker and eliminating the diuretic   Review of Systems Review of systems otherwise negative negative except for a red bumpy rash on his anterior chest wall    Objective:   Physical Exam  Well-developed well-nourished male no acute distress vital signs stable he is afebrile BP right arm sitting position 110/70  Examination chest shows a fine macular red lesions in the middle of the chest in the sternal area.      Assessment & Plan:  Hypertension ago actually BP too low,,,,,,,, change to plain beta blocker 12.5 mg daily  BPH with increased frequency of urination,,,,,,,, continue Proscar,,,,,, consult with Dr. Alinda Hansen  Rash anterior chest wall,,,,,,,, OTC cortisone cream

## 2015-04-04 NOTE — Progress Notes (Signed)
Pre visit review using our clinic review tool, if applicable. No additional management support is needed unless otherwise documented below in the visit note. 

## 2015-04-04 NOTE — Patient Instructions (Signed)
Stop the Tenoretic completely  Tenormin 25 mg.......... one half tab daily in the morning  Fasting lipid panel next week........ I will call you the report  Over-the-counter cortisone cream...Marland KitchenMarland KitchenMarland Kitchen small amounts twice daily to the rash on your chest  Beaulah Dinning is our new adult Designer, jewellery from [place]

## 2015-04-06 ENCOUNTER — Ambulatory Visit: Payer: 59 | Admitting: Family Medicine

## 2015-04-11 ENCOUNTER — Other Ambulatory Visit (INDEPENDENT_AMBULATORY_CARE_PROVIDER_SITE_OTHER): Payer: 59

## 2015-04-11 DIAGNOSIS — E785 Hyperlipidemia, unspecified: Secondary | ICD-10-CM | POA: Diagnosis not present

## 2015-04-11 LAB — LIPID PANEL
CHOLESTEROL: 204 mg/dL — AB (ref 0–200)
HDL: 39.4 mg/dL (ref >39.00)
NONHDL: 164.6
Total CHOL/HDL Ratio: 5
Triglycerides: 340 mg/dL — ABNORMAL HIGH (ref 0.0–149.0)
VLDL: 68 mg/dL — AB (ref 0.0–40.0)

## 2015-04-11 LAB — LDL CHOLESTEROL, DIRECT: LDL DIRECT: 107 mg/dL

## 2015-06-22 ENCOUNTER — Other Ambulatory Visit: Payer: Self-pay | Admitting: Family Medicine

## 2015-09-14 ENCOUNTER — Telehealth: Payer: Self-pay | Admitting: Family Medicine

## 2015-09-14 DIAGNOSIS — R413 Other amnesia: Secondary | ICD-10-CM

## 2015-09-14 NOTE — Telephone Encounter (Signed)
Pt wife is calling would like to know if dr todd would refer her husband to neurologist for memory issue. Per pt wife memory has gotten worst.

## 2015-09-18 NOTE — Telephone Encounter (Signed)
Referral placed per Dr Todd 

## 2015-09-21 ENCOUNTER — Telehealth: Payer: Self-pay | Admitting: Family Medicine

## 2015-09-21 DIAGNOSIS — R413 Other amnesia: Secondary | ICD-10-CM

## 2015-09-21 NOTE — Telephone Encounter (Signed)
Pt changed mind and would like to go to [facility] to see Floyde Parkins. He is set up to go to [facility]. Will you please place a referral for [facility] [facility] and Suzi Roots will take care of the rest.

## 2015-09-27 ENCOUNTER — Encounter: Payer: Self-pay | Admitting: Neurology

## 2015-09-27 ENCOUNTER — Telehealth: Payer: Self-pay | Admitting: Neurology

## 2015-09-27 ENCOUNTER — Ambulatory Visit (INDEPENDENT_AMBULATORY_CARE_PROVIDER_SITE_OTHER): Payer: 59 | Admitting: Neurology

## 2015-09-27 VITALS — BP 138/78 | HR 59 | Ht 72.5 in | Wt 220.0 lb

## 2015-09-27 DIAGNOSIS — R413 Other amnesia: Secondary | ICD-10-CM | POA: Diagnosis not present

## 2015-09-27 HISTORY — DX: Other amnesia: R41.3

## 2015-09-27 NOTE — Progress Notes (Signed)
Reason for visit: Memory disorder  Referring physician: Dr. Verlee Monte is a 59 y.o. male  History of present illness:  Brent Hansen is a 59 year old right-handed [name] male with a history of testicular cancer about 15 years ago, status post chemotherapy treatment. The patient indicates that since that time he has had some mild cognitive issues, but he has been functioning relatively well until about 6-12 months ago. The patient has begun noticing increasing problems with memory, mainly short-term memory. He is having more difficulty remembering names for people. He has difficulty recalling recent events, keeping up with characters while reading a novel. The patient denies any problems with directions while driving, he can keep up with medications and appointments fairly well. He does not repeat himself frequently. He is having some problem with sleeping at night, he has urinary urgency and he has to get up 2-4 times at night. The patient does snore at times. He denies any numbness or weakness of the extremities, he denies balance issues. 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 He is sent to this office for further evaluation. He continues to work, the memory issue does not prevent him from performing his job tasks.  Past Medical History  Diagnosis Date  . Hyperlipidemia   . Hypertension   . Vitiligo   . Hx of colonic polyps   . Cancer ([place])     testicular  . Memory disorder 09/27/2015    Past Surgical History  Procedure Laterality Date  . Orchiectomy      testicular cancer  . Biceps tendon repair Right   . Umbilical hernia repair    . Tonsillectomy      Family History  Problem Relation Age of Onset  . Heart attack      family hx  . Hypertension      family hx  . Hyperlipidemia      family hx  . Diabetes      family hx  . Diverticulitis Mother   . Diabetes Father   . Stroke Father   . Hypertension Father   .  Diverticulitis Brother   . Diabetes Brother     Social history:  reports that he has never smoked. He has never used smokeless tobacco. He reports that he drinks alcohol. He reports that he does not use illicit drugs.  Medications:  Prior to Admission medications   Medication Sig Start Date End Date Taking? Authorizing Provider  atenolol (TENORMIN) 25 MG tablet Take 0.5 tablets (12.5 mg total) by mouth daily. 04/04/15  Yes Dorena Cookey, MD  aspirin 81 MG tablet Take 81 mg by mouth daily.      Historical Provider, MD     No Known Allergies  ROS:  Out of a complete 14 system review of symptoms, the patient complains only of the following symptoms, and all other reviewed systems are negative.  Snoring Urination problems Achy muscles Memory loss Anxiety, not enough sleep Sleepiness  Blood pressure 138/78, pulse 59, height 6' 0.5" (1.842 m), weight 220 lb (99.791 kg).  Physical Exam  General: The patient is alert and cooperative at the time of the examination.  Eyes: Pupils are equal, round, and reactive to light. Discs are flat bilaterally.  Neck: The neck is supple, no carotid bruits are noted.  Respiratory: The respiratory examination is clear.  Cardiovascular: The cardiovascular examination reveals a regular rate and rhythm, no obvious murmurs or rubs are noted.  Skin: Extremities are without significant edema.  Neurologic Exam  Mental status: The patient is alert and oriented x 3 at the time of the examination. The patient has apparent normal recent and remote memory, with an apparently normal attention span and concentration ability. Mini-Mental Status Examination done today shows a total score 29/30.  Cranial nerves: Facial symmetry is present. There is good sensation of the face to pinprick and soft touch bilaterally. The strength of the facial muscles and the muscles to head turning and shoulder shrug are normal bilaterally. Speech is well enunciated, no aphasia or  dysarthria is noted. Extraocular movements are full. On primary gaze, there is nystagmus of the right eye only. The patient has horizontal nystagmus with lateral gaze bilaterally involving both eyes. Visual fields are full. The tongue is midline, and the patient has symmetric elevation of the soft palate. No obvious hearing deficits are noted.  Motor: The motor testing reveals 5 over 5 strength of all 4 extremities. Good symmetric motor tone is noted throughout.  Sensory: Sensory testing is intact to pinprick, soft touch, vibration sensation, and position sense on all 4 extremities. No evidence of extinction is noted.  Coordination: Cerebellar testing reveals good finger-nose-finger and heel-to-shin bilaterally.  Gait and station: Gait is normal. Tandem gait is normal. Romberg is negative. No drift is seen.  Reflexes: Deep tendon reflexes are symmetric and normal bilaterally. Toes are downgoing bilaterally.   Assessment/Plan:  1. Mild memory disturbance  The patient has had a recent change in memory over the last 6-12 months. His father has dementia. The patient will be set up for blood work evaluation today, and MRI evaluation of the brain. He will follow-up in 6 months. We may consider medications for memory in the future, but currently the patient is functioning fairly well.  Jill Alexanders MD 09/27/2015 6:59 PM  [facility] [address] [place], [place] [address]  Phone [phone] Fax [phone]

## 2015-09-27 NOTE — Patient Instructions (Signed)
    We will check MRI of the brain, and check blood work today.

## 2015-09-27 NOTE — Telephone Encounter (Signed)
Wife called regarding husband's visit today, wife had to leave, didn't get information about vit D, fish oil, B12, patient doesn't eat vegetables , what does Dr. Reino Bellis.

## 2015-09-27 NOTE — Telephone Encounter (Signed)
Returned call. No answer.  

## 2015-10-02 LAB — VITAMIN B12: Vitamin B-12: 275 pg/mL (ref 211–946)

## 2015-10-02 LAB — SEDIMENTATION RATE: SED RATE: 3 mm/h (ref 0–30)

## 2015-10-02 LAB — METHYLMALONIC ACID, SERUM: METHYLMALONIC ACID: 143 nmol/L (ref 0–378)

## 2015-10-02 LAB — RPR: RPR Ser Ql: NONREACTIVE

## 2015-10-02 MED ORDER — PHOSPHATIDYLSERINE-DHA-EPA 100-19.5-6.5 MG PO CAPS: 1.0000 | ORAL_CAPSULE | Freq: Every day | ORAL | Status: DC

## 2015-10-02 NOTE — Telephone Encounter (Signed)
I talk with the daughter. Okay to take vitamin B12, 500 g, fish oil does not do much for memory, but coconut oil may help, but she does not believe that he will take that. I will call in a prescription for Vayacog. MRI of the brain was done, apparently at [facility] orthopedic office, I have not received the report. They will call over and have them send me the written report of the study.

## 2015-10-02 NOTE — Telephone Encounter (Signed)
Pt's wife returned call. Please call and advise

## 2015-10-02 NOTE — Telephone Encounter (Signed)
Tried to reach patient. No answer. Tried to leave message on cell# per DPR, cell# fast busy signal.

## 2015-10-02 NOTE — Telephone Encounter (Signed)
-----   Message from Kathrynn Ducking, MD sent at 10/02/2015  7:20 AM EST -----  The blood work results are unremarkable. Please call the patient.  ----- Message -----    From: [facility] Lab Results In Interface    Sent: 09/28/2015   7:45 AM      To: Kathrynn Ducking, MD

## 2015-10-02 NOTE — Telephone Encounter (Signed)
Spoke to spouse. Gave lab results. Spouse verbalized understanding and requested labs mailed. Spouse says she would like to speak w/ Dr. Jannifer Franklin b/c she had to leave during the visit. She has questions if MD thinks it's necessary for patient to take  B12, Vitamin D, and Fish Oil. She says if she is unavailable when MD returns call ok to leave a detailed message [phone].

## 2015-10-02 NOTE — Telephone Encounter (Deleted)
-----   Message from Kathrynn Ducking, MD sent at 10/02/2015  7:20 AM EST -----  The blood work results are unremarkable. Please call the patient.  ----- Message -----    From: [facility] Lab Results In Interface    Sent: 09/28/2015   7:45 AM      To: Kathrynn Ducking, MD

## 2015-10-04 ENCOUNTER — Telehealth: Payer: Self-pay | Admitting: Neurology

## 2015-10-04 DIAGNOSIS — I6789 Other cerebrovascular disease: Secondary | ICD-10-CM

## 2015-10-04 NOTE — Telephone Encounter (Signed)
MRI of the brain done without contrast shows a significant amount of Behnken matter changes consistent with small vessel disease. A left cerebellar lacunar infarct is noted, left sphenoid sinusitis is seen, there appears to be fluid in the left middle ear and mastoid air cells.  I called patient. The patient has significant Goyal matter changes could be part of the reason for the memory issue. He appears to have sinusitis, not clear if these having symptoms, may need to go on antibiotics if this is the case.

## 2015-10-05 NOTE — Telephone Encounter (Signed)
Patient returned call, please call back between now and whenever Dr. Jannifer Franklin leaves today.

## 2015-10-05 NOTE — Telephone Encounter (Signed)
I called patient. The patient does have a history of hypertension, but his blood pressures have never been way out of control at any time. The small vessel changes in the brain are out of proportion to what one would expect. He is on aspirin. I will go on to get a carotid Doppler study, and a 2-D echocardiogram.

## 2015-10-05 NOTE — Telephone Encounter (Signed)
Pt called back regarding what was discussed. He has concerns and would like to speak with Dr. Jannifer Franklin . Please call and advise [phone] ( cell) time: 12-1p or after 3p. Thank you

## 2015-10-05 NOTE — Addendum Note (Signed)
Addended by: Margette Fast on: 10/05/2015 03:39 PM   Modules accepted: Orders

## 2015-10-05 NOTE — Telephone Encounter (Signed)
I called patient, left a message, I will call back later. 

## 2015-10-18 ENCOUNTER — Other Ambulatory Visit: Payer: 59

## 2015-10-18 ENCOUNTER — Ambulatory Visit (INDEPENDENT_AMBULATORY_CARE_PROVIDER_SITE_OTHER): Payer: 59

## 2015-10-18 DIAGNOSIS — I6789 Other cerebrovascular disease: Secondary | ICD-10-CM | POA: Diagnosis not present

## 2015-10-19 ENCOUNTER — Other Ambulatory Visit: Payer: Self-pay

## 2015-10-19 ENCOUNTER — Ambulatory Visit (HOSPITAL_COMMUNITY): Payer: 59 | Attending: Internal Medicine

## 2015-10-19 DIAGNOSIS — I5189 Other ill-defined heart diseases: Secondary | ICD-10-CM | POA: Insufficient documentation

## 2015-10-19 DIAGNOSIS — E785 Hyperlipidemia, unspecified: Secondary | ICD-10-CM | POA: Insufficient documentation

## 2015-10-19 DIAGNOSIS — I517 Cardiomegaly: Secondary | ICD-10-CM | POA: Diagnosis not present

## 2015-10-19 DIAGNOSIS — I6789 Other cerebrovascular disease: Secondary | ICD-10-CM | POA: Insufficient documentation

## 2015-10-19 DIAGNOSIS — I340 Nonrheumatic mitral (valve) insufficiency: Secondary | ICD-10-CM | POA: Insufficient documentation

## 2015-10-19 DIAGNOSIS — I059 Rheumatic mitral valve disease, unspecified: Secondary | ICD-10-CM | POA: Diagnosis not present

## 2015-10-19 DIAGNOSIS — I639 Cerebral infarction, unspecified: Secondary | ICD-10-CM | POA: Diagnosis present

## 2015-10-19 DIAGNOSIS — I10 Essential (primary) hypertension: Secondary | ICD-10-CM | POA: Insufficient documentation

## 2015-10-20 ENCOUNTER — Other Ambulatory Visit (HOSPITAL_COMMUNITY): Payer: 59

## 2015-10-25 ENCOUNTER — Telehealth: Payer: Self-pay | Admitting: Neurology

## 2015-10-25 ENCOUNTER — Other Ambulatory Visit: Payer: 59

## 2015-10-25 NOTE — Telephone Encounter (Signed)
MRI of the brain was done without contrast only. This shows Brent Hansen matter changes within the cerebral hemispheres. Given this patient's age, demyelinating disease does need be considered. The patient does have a history of hypertension. We will consider MRI of the cervical spine to look at the cord. We may consider lumbar puncture, further blood work in the future.  The patient indicates that he has already had MRI of the cervical spine done at [facility]. I will try to get the study and review this. He does have blood pressure issues, he indicates that his blood pressures and never been severely out of control.

## 2015-10-25 NOTE — Telephone Encounter (Signed)
I called patient. The carotid Doppler study was unremarkable, 2-D echocardiogram has been done, result is pending.

## 2015-10-26 NOTE — Telephone Encounter (Signed)
I called [employer] and spoke to [place]. I requested the disc of the cervical spine MRI. She stated it would be mailed out today.

## 2015-10-30 ENCOUNTER — Telehealth: Payer: Self-pay | Admitting: Neurology

## 2015-10-30 NOTE — Telephone Encounter (Signed)
I called patient. I suggested doing a lumbar puncture, the patient wants to consider this, but he does not wish to have it done now, he will call me when he is ready.

## 2015-10-30 NOTE — Telephone Encounter (Signed)
I received the disc and the report of the MRI the cervical spine. This shows no spinal cord lesions. The patient appears to have multilevel spondylosis with disc osteophyte complexes at the C3-4, 45, a disc at the C5-6 and C6-7 levels with severe left foraminal stenosis at the C4-5 level and bilaterally at the C5-6 level.  Lack of spinal cord lesions makes the possibility of demyelinating disease less likely, changes by MRI the brain could solely represent small vessel disease.

## 2015-11-02 NOTE — Telephone Encounter (Signed)
It looks like the echocardiogram was done at [facility], the results are pending, we will need to call over and see what is going on.

## 2015-11-02 NOTE — Telephone Encounter (Signed)
Wife called to advise husband will keep 11/07/15 11am appointment, please call if there are any special instructions.

## 2015-11-02 NOTE — Telephone Encounter (Signed)
Brent Hansen pt's wife called again to also follow up on patient's echogram results. Brent Hansen can be reached [phone]

## 2015-11-02 NOTE — Telephone Encounter (Signed)
Pt's wife called sts pt is willing to have LP. She is wanting it done before the end of the year. She sts he could schedule it for this Friday or 11/16/15. Please call at [phone], can leave VM

## 2015-11-02 NOTE — Telephone Encounter (Signed)
I called the patient's wife. LP scheduled 12/20 at 11. She will call back if that is not okay with the patient. The patient's echo results are not available. It was completed 12/2. I advised that I would check on this.

## 2015-11-06 ENCOUNTER — Ambulatory Visit: Payer: 59 | Admitting: Neurology

## 2015-11-06 NOTE — Telephone Encounter (Signed)
LVM requesting call back re: echocardiogram done on 10/19/15 in their office. Requesting update on status of test results.  Left this office's number, this caller's name, Charisse Klinefelter, RN and Dr Jannifer Franklin as contacts for call back.

## 2015-11-07 ENCOUNTER — Telehealth: Payer: Self-pay | Admitting: Neurology

## 2015-11-07 ENCOUNTER — Ambulatory Visit (INDEPENDENT_AMBULATORY_CARE_PROVIDER_SITE_OTHER): Payer: 59 | Admitting: Neurology

## 2015-11-07 ENCOUNTER — Encounter: Payer: Self-pay | Admitting: Neurology

## 2015-11-07 VITALS — BP 169/91 | HR 62

## 2015-11-07 DIAGNOSIS — R930 Abnormal findings on diagnostic imaging of skull and head, not elsewhere classified: Secondary | ICD-10-CM | POA: Diagnosis not present

## 2015-11-07 DIAGNOSIS — R9089 Other abnormal findings on diagnostic imaging of central nervous system: Secondary | ICD-10-CM | POA: Insufficient documentation

## 2015-11-07 DIAGNOSIS — R413 Other amnesia: Secondary | ICD-10-CM | POA: Diagnosis not present

## 2015-11-07 NOTE — Telephone Encounter (Signed)
I called [facility] and spoke to [facility]. The echo has now been resulted and the final result is showing in [place].

## 2015-11-07 NOTE — Procedures (Signed)
    Lumbar puncture procedure note  History:  Brent Hansen is a 59 year old gentleman with a history of a memory disturbance, and evidence of Yerger matter changes on MRI of the brain. The patient is being evaluated for possible demyelinating disease. He comes in for this procedure.  The patient was placed in the fetal position on the right side, and the low back was cleaned with Betadine solution. Approximately 2 cc of 1% Xylocaine was used as a local anesthetic. A 20-gauge spinal needle was inserted into the L3-4 interspace, and approximately 18 cc of clear colorless spinal fluid was removed for testing. Opening pressure was 170 mm of water.  Tube #1 was sent for VDRL, cryptococcal antigen, angiotensin-converting enzyme level.  Tube #2 was sent for oligoclonal banding, IgG albumin ratio.  Tube #3 was sent for cells, differential, glucose, and protein.  Tube #4 was sent for Lyme antibody panel.  The patient tolerated the procedure well. No complications of the above procedure were noted.   Blood work was sent for blood work for antibody comparison.   Consent form was signed by patient.

## 2015-11-07 NOTE — Telephone Encounter (Signed)
The 2-D echocardiogram has been done. It shows possible artifact on the mitral valve. Otherwise, no significant abnormalities were seen. The results were discussed with the patient and his wife.   2-D echocardiogram results:  Study Conclusions  - Left ventricle: The cavity size was normal. Wall thickness was increased in a pattern of mild LVH. There was moderate focal basal hypertrophy of the septum. Systolic function was normal. The estimated ejection fraction was in the range of 55% to 60%. Wall motion was normal; there were no regional wall motion abnormalities. Doppler parameters are consistent with abnormal left ventricular relaxation (grade 1 diastolic dysfunction). The E/e&' ratio is between 8-15, suggesting indeterminate LV filling pressure. - Mitral valve: Calcified annulus. Mildly thickened leaflets . There was mild regurgitation. - Left atrium: The atrium was at the upper limits of normal in size. Calcified 0.75 cm round echogenic mass noted to come in and out of echo plane in association with the anterior leaflet. This is also seen in association with the posterior leaflet and I suspect it is an imaging artifact. Consider TEE to further evaluate if there is concern for recent cardiac embolus. - Inferior vena cava: The vessel was normal in size. The respirophasic diameter changes were in the normal range (>= 50%), consistent with normal central venous pressure.  Impressions:  - LVEF 55-60%, mild LVH, normal wall motion, diastolic dysfunction, indeterminate LV filling pressure, MAC, mild MR, upper normal LA size, calcified mobile &quot;mass&quot; in the LA, seen with motion of the anterior and posterior leaflets, therefore, it is likely artifact.

## 2015-11-07 NOTE — Telephone Encounter (Signed)
I discussed the results of the 2-D echocardiogram with the patient.

## 2015-11-07 NOTE — Progress Notes (Signed)
Please refer to lumbar puncture procedure note

## 2015-11-08 LAB — CELL COUNT, CSF

## 2015-11-10 LAB — IGG CSF INDEX
ALBUMIN CSF-MCNC: 35 mg/dL (ref 11–48)
Albumin: 4 g/dL (ref 3.5–5.5)
IGG CSF: 4.7 mg/dL (ref 0.0–8.6)
IGG INDEX CSF: 0.5 (ref 0.0–0.7)
IGG/ALBUMIN RATIO, CSF: 0.13 (ref 0.00–0.25)
IgG (Immunoglobin G), Serum: 992 mg/dL (ref 700–1600)

## 2015-11-10 LAB — OLIGOCLONAL BANDS, CSF + SERM

## 2015-11-11 LAB — LYME, WESTERN BLOT, CSF
LYME IGG WB INTERP.: NEGATIVE
LYME IGM WB INTERP.: NEGATIVE
P18 AB.: ABSENT
P23 AB. IGM: ABSENT
P23 AB.: ABSENT
P28 AB.: ABSENT
P30 AB.: ABSENT
P39 Ab. IgM: ABSENT
P39 Ab.: ABSENT
P41 AB. IGM: ABSENT
P45 AB.: ABSENT
P58 Ab.: ABSENT
P66 Ab.: ABSENT
P93 Ab.: ABSENT

## 2015-11-11 LAB — ANGIOTENSIN CONVERTING ENZYME, CSF: Angio Convert Enzyme, CSF: 2.1 U/L (ref 0.0–2.5)

## 2015-11-11 LAB — VDRL, CSF: VDRL Quant, CSF: NONREACTIVE

## 2015-11-11 LAB — GLUCOSE, CSF: GLUCOSE CSF: 58 mg/dL (ref 40–70)

## 2015-11-11 LAB — PROTEIN, CSF: Protein, CSF: 49.9 mg/dL — ABNORMAL HIGH (ref 0.0–44.0)

## 2015-11-18 LAB — CRYPTOCOCCUS ANTIGEN, CSF: MPI.26.183017: NEGATIVE

## 2015-11-19 ENCOUNTER — Telehealth: Payer: Self-pay | Admitting: Neurology

## 2015-11-19 HISTORY — PX: BACK SURGERY: SHX140

## 2015-11-19 HISTORY — PX: ELBOW FRACTURE SURGERY: SHX616

## 2015-11-19 MED ORDER — DONEPEZIL HCL 5 MG PO TABS: 5.0000 mg | ORAL_TABLET | Freq: Every day | ORAL | Status: DC

## 2015-11-19 NOTE — Telephone Encounter (Signed)
I called patient. Spinal fluid results are unremarkable, no evidence of oligoclonal banding that would suggest multiple sclerosis. We will consider use of Aricept. Changes by MRI likely represent small vessel disease. I have called in a prescription for Aricept. The patient mainly notes difficulty with remembering names.

## 2015-11-21 ENCOUNTER — Encounter: Payer: Self-pay | Admitting: Neurology

## 2016-02-19 ENCOUNTER — Telehealth: Payer: Self-pay | Admitting: Family Medicine

## 2016-02-19 NOTE — Telephone Encounter (Signed)
Pt would like to know if Dr Sherren Mocha will work him in for a CPE.   Pt states he will be moving this year and has had Dr Sherren Mocha 20 +yrs. Prefers not to start with another provider. Pt states a October CPE will not work for him. Please advise

## 2016-02-20 NOTE — Telephone Encounter (Signed)
Okay to work in per Dr Todd 

## 2016-02-22 NOTE — Telephone Encounter (Signed)
Pt has been scheduled.  °

## 2016-03-07 ENCOUNTER — Other Ambulatory Visit (INDEPENDENT_AMBULATORY_CARE_PROVIDER_SITE_OTHER): Payer: 59

## 2016-03-07 DIAGNOSIS — Z0000 Encounter for general adult medical examination without abnormal findings: Secondary | ICD-10-CM

## 2016-03-07 DIAGNOSIS — R7989 Other specified abnormal findings of blood chemistry: Secondary | ICD-10-CM | POA: Diagnosis not present

## 2016-03-07 LAB — POC URINALSYSI DIPSTICK (AUTOMATED)
BILIRUBIN UA: NEGATIVE
Blood, UA: NEGATIVE
GLUCOSE UA: NEGATIVE
Ketones, UA: NEGATIVE
Leukocytes, UA: NEGATIVE
NITRITE UA: NEGATIVE
Protein, UA: NEGATIVE
Spec Grav, UA: 1.02
Urobilinogen, UA: 0.2
pH, UA: 6.5

## 2016-03-07 LAB — CBC WITH DIFFERENTIAL/PLATELET
BASOS PCT: 0.7 % (ref 0.0–3.0)
Basophils Absolute: 0 10*3/uL (ref 0.0–0.1)
EOS PCT: 4.2 % (ref 0.0–5.0)
Eosinophils Absolute: 0.3 10*3/uL (ref 0.0–0.7)
HCT: 45 % (ref 39.0–52.0)
HEMOGLOBIN: 15.5 g/dL (ref 13.0–17.0)
Lymphocytes Relative: 21.9 % (ref 12.0–46.0)
Lymphs Abs: 1.5 10*3/uL (ref 0.7–4.0)
MCHC: 34.6 g/dL (ref 30.0–36.0)
MCV: 87.6 fl (ref 78.0–100.0)
MONO ABS: 0.5 10*3/uL (ref 0.1–1.0)
MONOS PCT: 8.1 % (ref 3.0–12.0)
Neutro Abs: 4.4 10*3/uL (ref 1.4–7.7)
Neutrophils Relative %: 65.1 % (ref 43.0–77.0)
Platelets: 180 10*3/uL (ref 150.0–400.0)
RBC: 5.13 Mil/uL (ref 4.22–5.81)
RDW: 13.6 % (ref 11.5–15.5)
WBC: 6.7 10*3/uL (ref 4.0–10.5)

## 2016-03-07 LAB — BASIC METABOLIC PANEL
BUN: 16 mg/dL (ref 6–23)
CHLORIDE: 104 meq/L (ref 96–112)
CO2: 25 mEq/L (ref 19–32)
Calcium: 9.4 mg/dL (ref 8.4–10.5)
Creatinine, Ser: 0.95 mg/dL (ref 0.40–1.50)
GFR: 86.39 mL/min (ref >60.00)
GLUCOSE: 116 mg/dL — AB (ref 70–99)
POTASSIUM: 3.9 meq/L (ref 3.5–5.1)
SODIUM: 138 meq/L (ref 135–145)

## 2016-03-07 LAB — LIPID PANEL
CHOLESTEROL: 230 mg/dL — AB (ref 0–200)
HDL: 42.6 mg/dL (ref >39.00)
NonHDL: 187.05
TRIGLYCERIDES: 275 mg/dL — AB (ref 0.0–149.0)
Total CHOL/HDL Ratio: 5
VLDL: 55 mg/dL — ABNORMAL HIGH (ref 0.0–40.0)

## 2016-03-07 LAB — PSA: PSA: 0.47 ng/mL (ref 0.10–4.00)

## 2016-03-07 LAB — HEPATIC FUNCTION PANEL
ALBUMIN: 4.2 g/dL (ref 3.5–5.2)
ALT: 14 U/L (ref 0–53)
AST: 16 U/L (ref 0–37)
Alkaline Phosphatase: 44 U/L (ref 39–117)
Bilirubin, Direct: 0.1 mg/dL (ref 0.0–0.3)
TOTAL PROTEIN: 6.9 g/dL (ref 6.0–8.3)
Total Bilirubin: 0.6 mg/dL (ref 0.2–1.2)

## 2016-03-07 LAB — TSH: TSH: 2.34 u[IU]/mL (ref 0.35–4.50)

## 2016-03-07 LAB — LDL CHOLESTEROL, DIRECT: Direct LDL: 141 mg/dL

## 2016-03-12 ENCOUNTER — Other Ambulatory Visit: Payer: Self-pay | Admitting: Neurology

## 2016-03-20 ENCOUNTER — Encounter: Payer: Self-pay | Admitting: Family Medicine

## 2016-03-20 ENCOUNTER — Ambulatory Visit (INDEPENDENT_AMBULATORY_CARE_PROVIDER_SITE_OTHER): Payer: 59 | Admitting: Family Medicine

## 2016-03-20 VITALS — BP 130/90 | Temp 98.3°F | Ht 72.5 in | Wt 223.0 lb

## 2016-03-20 DIAGNOSIS — I10 Essential (primary) hypertension: Secondary | ICD-10-CM

## 2016-03-20 DIAGNOSIS — L80 Vitiligo: Secondary | ICD-10-CM

## 2016-03-20 DIAGNOSIS — R7309 Other abnormal glucose: Secondary | ICD-10-CM

## 2016-03-20 DIAGNOSIS — Z0000 Encounter for general adult medical examination without abnormal findings: Secondary | ICD-10-CM | POA: Insufficient documentation

## 2016-03-20 DIAGNOSIS — E785 Hyperlipidemia, unspecified: Secondary | ICD-10-CM

## 2016-03-20 LAB — HEMOGLOBIN A1C: Hgb A1c MFr Bld: 5.8 % (ref 4.6–6.5)

## 2016-03-20 MED ORDER — ATENOLOL 25 MG PO TABS: 12.5000 mg | ORAL_TABLET | Freq: Every day | ORAL | Status: DC

## 2016-03-20 MED ORDER — ATORVASTATIN CALCIUM 10 MG PO TABS: 10.0000 mg | ORAL_TABLET | Freq: Every day | ORAL | Status: DC

## 2016-03-20 MED ORDER — FINASTERIDE 5 MG PO TABS: 5.0000 mg | ORAL_TABLET | Freq: Every day | ORAL | Status: DC

## 2016-03-20 MED ORDER — SILDENAFIL CITRATE 20 MG PO TABS
20.0000 mg | ORAL_TABLET | Freq: Every evening | ORAL | Status: DC | PRN
Start: 2016-03-20 — End: 2016-03-28

## 2016-03-20 NOTE — Patient Instructions (Addendum)
Continue current medications  Follow-up for general physical exam in one year sooner if any problems,,,,,, Tommi Rumps or Almyra Free are 2 new nurse practitioners or Dr. [place]  Sugar free diet,,, walk 30 minutes daily,,,,,,, A1c today,,,,,,,, if it is abnormal I will call you  Lipitor 10 mg.........Marland Kitchen 1 tablet Monday Wednesday Friday  Follow-up fasting labs the second week in September........... follow-up with me a week after labs

## 2016-03-20 NOTE — Progress Notes (Signed)
Subjective:    Patient ID: Brent Hansen, male    DOB: 03-25-1956, 60 y.o.   MRN: [id number]  HPI time is a 60 year old married male nonsmoker who comes in today for general physical examination  He takes Tenormin 25 mg dose one half tab daily for hypertension BP is 130/90  He sees the urologist on a regular basis. Has a history of prostate cancer. He's on finasteride  He takes Aricept 5 mg once daily at bedtime because of a history of slight memory loss. He's been evaluated by Dr. Jannifer Franklin. He was told he had 2 areas in his brain where he might of had an old stroke. His blood pressures under good control with his Tenormin blood sugar is slightly elevated we'll get a follow-up A1c although that's been his pattern in the past also his lipids cholesterol is not bad but his LDL is in the 140 range. The question is should we get below 75. He was on a cholesterol-lowering medicine in the past but stopped it for unknown reasons. He gets routine eye care, dental care, colonoscopy equal GI 2 years ago was normal. He has a history of colon polyps  Vaccinations up-to-date  He tells me he went to orthopedics and then subsequently to neurosurgery because of neck pain. He was diagnosed with a C5-C6 radiculopathy. He had an injection a week ago. The first 48 hours he was pain-free and now the pain is recurring. He's to call Dr. Ronnald Ramp for follow-up.  Cognitive function normal he used to exercise daily but can't anymore because of the neck pain, home health safety reviewed no issues identified, no guns in the house, he does have a healthcare power of attorney and living well    Review of Systems  Constitutional: Negative.   HENT: Negative.   Eyes: Negative.   Respiratory: Negative.   Cardiovascular: Negative.   Gastrointestinal: Negative.   Endocrine: Negative.   Genitourinary: Negative.   Musculoskeletal: Negative.   Skin: Negative.   Allergic/Immunologic: Negative.   Neurological: Negative.     Hematological: Negative.   Psychiatric/Behavioral: Negative.        Objective:   Physical Exam  Constitutional: He is oriented to person, place, and time. He appears well-developed and well-nourished.  HENT:  Head: Normocephalic and atraumatic.  Right Ear: External ear normal.  Left Ear: External ear normal.  Nose: Nose normal.  Mouth/Throat: Oropharynx is clear and moist.  Eyes: Conjunctivae and EOM are normal. Pupils are equal, round, and reactive to light.  Neck: Normal range of motion. Neck supple. No JVD present. No tracheal deviation present. No thyromegaly present.  Cardiovascular: Normal rate, regular rhythm, normal heart sounds and intact distal pulses.  Exam reveals no gallop and no friction rub.   No murmur heard. Pulmonary/Chest: Effort normal and breath sounds normal. No stridor. No respiratory distress. He has no wheezes. He has no rales. He exhibits no tenderness.  Abdominal: Soft. Bowel sounds are normal. He exhibits no distension and no mass. There is no tenderness. There is no rebound and no guarding.  Genitourinary: Rectum normal, prostate normal and penis normal. Guaiac negative stool. No penile tenderness.  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 He exhibits no edema or tenderness.  Lymphadenopathy:    He has no cervical adenopathy.  Neurological: He is alert and oriented to person, place, and time. He has normal reflexes. No cranial nerve deficit. He exhibits normal muscle tone.  Skin: Skin is warm and dry. No rash noted. No erythema. No pallor.  Vitiligo mostly involving his hands  Psychiatric: He has a normal mood and affect. His behavior is normal. Judgment and thought content normal.  Nursing note and vitals reviewed.         Assessment & Plan:  Healthy male  History of mild cognitive impairment,,,,,,,,, recently evaluated by Dr. Floyde Parkins,,,,,,,, on Aricept 5 mg  daily also stressed good blood pressure control blood sugar normalization.......Marland Kitchen we'll check A1c....... start Lipitor 10 mg Monday Wednesday Friday to lower LDL  Status post testicular cancer,,,,, surgery 15 years ago no evidence of recurrence  2 dentist disease C5-C6,,,,,,, followed by Dr. Ronnald Ramp in neurosurgery  History of hypertension.......... control with Tenormin 12.5 mg daily  Symptoms of BPH with nocturia,,,,,,, finasteride 5 mg daily

## 2016-03-20 NOTE — Progress Notes (Signed)
Pre visit review using our clinic review tool, if applicable. No additional management support is needed unless otherwise documented below in the visit note. 

## 2016-03-21 ENCOUNTER — Telehealth: Payer: Self-pay | Admitting: Family Medicine

## 2016-03-21 NOTE — Telephone Encounter (Signed)
PA done in Cover My Meds. Waiting on response within 72 hours

## 2016-03-25 ENCOUNTER — Telehealth: Payer: Self-pay | Admitting: Family Medicine

## 2016-03-25 NOTE — Telephone Encounter (Signed)
Spoke with wife and mailed diabetic education materials to home address per wife's request.

## 2016-03-25 NOTE — Telephone Encounter (Signed)
Pt called and ask for a call back. Brent Hansen he has questions about his physical he had last week    [phone]

## 2016-03-26 ENCOUNTER — Ambulatory Visit (INDEPENDENT_AMBULATORY_CARE_PROVIDER_SITE_OTHER): Payer: 59 | Admitting: Neurology

## 2016-03-26 ENCOUNTER — Encounter: Payer: Self-pay | Admitting: Neurology

## 2016-03-26 VITALS — BP 134/82 | HR 64 | Ht 72.5 in | Wt 220.5 lb

## 2016-03-26 DIAGNOSIS — R930 Abnormal findings on diagnostic imaging of skull and head, not elsewhere classified: Secondary | ICD-10-CM

## 2016-03-26 DIAGNOSIS — R9089 Other abnormal findings on diagnostic imaging of central nervous system: Secondary | ICD-10-CM

## 2016-03-26 DIAGNOSIS — R413 Other amnesia: Secondary | ICD-10-CM

## 2016-03-26 MED ORDER — DONEPEZIL HCL 10 MG PO TABS: 10.0000 mg | ORAL_TABLET | Freq: Every day | ORAL | Status: DC

## 2016-03-26 NOTE — Progress Notes (Signed)
Reason for visit: Memory disorder  Brent Hansen is an 60 y.o. male  History of present illness:  Brent Hansen is a 60 year old right-handed [place] male with a history of a memory disorder. The patient was found to have multiple Lauture matter lesions on MRI of the brain that was done on 09/28/2015. The patient has undergone a workup including blood work and lumbar puncture. No evidence of demyelinating disease was noted. The patient has undergone a carotid Doppler study that was unremarkable and a 2-D echocardiogram that did not show definite source of cardiogenic stroke. The patient is on aspirin, he has been treated for cholesterol. He has recently been placed on Sildanafil. The patient was found to have some glucose intolerance and told to go on a low carbohydrate diet. The patient has been placed on Aricept for the memory taking 5 mg at night. The patient is tolerating the medication, but it has not improved his memory. He has some short-term memory issues, he has difficulty recalling specific facts that are important while at work. He denies any issues with driving. He returns to this office for an evaluation.  Past Medical History  Diagnosis Date  . Hyperlipidemia   . Hypertension   . Vitiligo   . Hx of colonic polyps   . Cancer ([place])     testicular  . Memory disorder 09/27/2015    Past Surgical History  Procedure Laterality Date  . Orchiectomy      testicular cancer  . Biceps tendon repair Right   . Umbilical hernia repair    . Tonsillectomy      Family History  Problem Relation Age of Onset  . Heart attack      family hx  . Hypertension      family hx  . Hyperlipidemia      family hx  . Diabetes      family hx  . Diverticulitis Mother   . Diabetes Father   . Stroke Father   . Hypertension Father   . Diverticulitis Brother   . Diabetes Brother     Social history:  reports that he has never smoked. He has never used smokeless tobacco. He reports that he drinks  alcohol. He reports that he does not use illicit drugs.   No Known Allergies  Medications:  Prior to Admission medications   Medication Sig Start Date End Date Taking? Authorizing Provider  aspirin 81 MG tablet Take 81 mg by mouth daily. Reported on 03/20/2016   Yes Historical Provider, MD  atenolol (TENORMIN) 25 MG tablet Take 0.5 tablets (12.5 mg total) by mouth daily. 03/20/16  Yes Dorena Cookey, MD  atorvastatin (LIPITOR) 10 MG tablet Take 1 tablet (10 mg total) by mouth daily. 03/20/16  Yes Dorena Cookey, MD  donepezil (ARICEPT) 5 MG tablet TAKE 1 TABLET BY MOUTH EVERY DAY AT BEDTIME 03/12/16  Yes Kathrynn Ducking, MD  finasteride (PROSCAR) 5 MG tablet Take 1 tablet (5 mg total) by mouth daily. 03/20/16  Yes Dorena Cookey, MD  Phosphatidylserine-DHA-EPA (VAYACOG) 100-19.5-6.5 MG CAPS Take 1 capsule by mouth daily. 10/02/15  Yes Kathrynn Ducking, MD  sildenafil (REVATIO) 20 MG tablet Take 1 tablet (20 mg total) by mouth at bedtime as needed. 03/20/16  Yes Dorena Cookey, MD    ROS:  Out of a complete 14 system review of symptoms, the patient complains only of the following symptoms, and all other reviewed systems are negative.  Memory disturbance  Blood  pressure 134/82, pulse 64, height 6' 0.5" (1.842 m), weight 220 lb 8 oz (100.018 kg).  Physical Exam  General: The patient is alert and cooperative at the time of the examination.  Skin: No significant peripheral edema is noted.   Neurologic Exam  Mental status: The patient is alert and oriented x 3 at the time of the examination. The patient has apparent normal recent and remote memory, with an apparently normal attention span and concentration ability. Mini-Mental Status Examination done today shows a total score of 30/30.   Cranial nerves: Facial symmetry is present. Speech is normal, no aphasia or dysarthria is noted. Extraocular movements are full. Visual fields are full.  Motor: The patient has good strength in all 4  extremities.  Sensory examination: Soft touch sensation is symmetric on the face, arms, and legs.  Coordination: The patient has good finger-nose-finger and heel-to-shin bilaterally.  Gait and station: The patient has a normal gait. Tandem gait is normal. Romberg is negative. No drift is seen.  Reflexes: Deep tendon reflexes are symmetric.   Assessment/Plan:  1. Mild memory disturbance  2. Abnormal MRI brain  The patient will be increased on Aricept taking 10 mg at night. He will be set up for a repeat MRI of the brain, we will compare to the prior study done last November. The patient will follow-up in 6 months, sooner if needed.  Jill Alexanders MD 03/26/2016 6:49 PM  [facility] [address] [place], [place] [address]  Phone [phone] Fax [phone]

## 2016-03-28 ENCOUNTER — Other Ambulatory Visit: Payer: Self-pay | Admitting: General Practice

## 2016-03-28 MED ORDER — SILDENAFIL CITRATE 20 MG PO TABS: 20.0000 mg | ORAL_TABLET | Freq: Every evening | ORAL | Status: DC | PRN

## 2016-03-29 NOTE — Telephone Encounter (Signed)
Prior authorization for sildenafil (REVATIO)   denied.  Left vm notifing pt

## 2016-04-04 ENCOUNTER — Telehealth: Payer: Self-pay | Admitting: Family Medicine

## 2016-04-04 NOTE — Telephone Encounter (Signed)
PA for sildenafil is denied. Only covered if patient has pulmonary arterial hypertension.

## 2016-04-24 ENCOUNTER — Telehealth: Payer: Self-pay | Admitting: Neurology

## 2016-04-24 NOTE — Telephone Encounter (Signed)
Pt's wife called to check on MRI auth. Please call [phone] Order was placed 5/9- Do they need to call [facility]?

## 2016-04-26 ENCOUNTER — Telehealth: Payer: Self-pay | Admitting: Neurology

## 2016-04-26 NOTE — Telephone Encounter (Signed)
Patients wife requested that I make you aware that the patient is having surgery on his spine within the next month. They have not completed the MRI yet but he is calling to have it scheduled today with [employer] [facility] [employer]. No need to return call.

## 2016-04-26 NOTE — Telephone Encounter (Signed)
Called and spoke with the patients wife. I stated to her that it was noted in the chart that the patient requested to be sent to [facility] and that Seth Bake had noted it was sent and she had tried calling the patient to give him the number to schedule. Called [employer] and scheduled the patient.

## 2016-04-26 NOTE — Telephone Encounter (Signed)
Noted, just need to make sure that I get the report of the MRI of the brain, hopefully the disc as well.

## 2016-05-09 ENCOUNTER — Telehealth: Payer: Self-pay | Admitting: Neurology

## 2016-05-09 NOTE — Telephone Encounter (Signed)
Patients wife called and wanted to inform us that her husband had the MRI at [place] and requested we send results to Dr. Ronnald Ramp at [facility].

## 2016-05-13 ENCOUNTER — Telehealth: Payer: Self-pay | Admitting: *Deleted

## 2016-05-13 NOTE — Telephone Encounter (Addendum)
Receive report from [facility], pt report on [employer].

## 2016-05-14 ENCOUNTER — Telehealth: Payer: Self-pay | Admitting: Neurology

## 2016-05-14 NOTE — Telephone Encounter (Signed)
MRI the brain and cervical spine were done at [facility], MRI the brain continues to show Vidana matter abnormalities, no change from the prior study done in November 2016.  MRI of the cervical spine was also done showing multilevel spondylosis, the patient has bilateral foraminal stenosis at C5-6 level and get left moderate right neural foraminal stenosis at C4-5 level, moderate left foraminal stenosis at the C6-7 level and moderate right and mild left foraminal stenosis at the C3-4 level. No spinal cord lesions are seen.   I called patient, left a message regarding the results of the above. No changes were seen on MRI the brain, no spinal cord lesion seen on cervical spine MRI.

## 2016-05-15 NOTE — Telephone Encounter (Addendum)
Pt wife called and requested a call back. Reviewed MRI results w/ her. She says that pt has cervical surgery scheduled tomorrow at the surgery center w/ Dr. Ronnald Ramp. Wanted to be sure that Dr. Jannifer Franklin was aware. Also reports that pt's memory is a lot worse. Had some HAs when first increasing Aricept but is now taking 10 mg daily w/o side effects. Mrs. Ahlin said that when they got back from the beach last week, pt realized that he had forgotten the cooler. She said that he also left it open w/ all of the food packed in it. Wife was very anxious when discussing this as well as 3 recent deaths (2 aunts and her old roommate's parent). Plans to keep previously scheduled 6 mo f/u appt in Nov.

## 2016-05-15 NOTE — Telephone Encounter (Signed)
I called the wife. The patient is having neck surgery tomorrow morning. The patient is in severe pain with his neck and down the arm. He has not responded to epidural steroid injections. The MRI the brain showed no changes, the patient is to have memory problems, I would stay on Aricept for now. We will follow-up in November.

## 2016-06-27 ENCOUNTER — Telehealth: Payer: Self-pay

## 2016-06-27 NOTE — Telephone Encounter (Signed)
Prior Authorization completed for sildenafil (REVATIO) 20 MG tablet  Key: [id number]

## 2016-06-28 ENCOUNTER — Telehealth: Payer: Self-pay

## 2016-06-28 NOTE — Telephone Encounter (Signed)
Prior Authorization denial received for sildenafil (REVATIO) 20 MG tablet  This request was denied because: Current plan approved criteria do not allow coverage of sildenafil if the patient doe snot have a diagnosis of pulmonary arterial hypertension ([place]).

## 2016-08-01 ENCOUNTER — Other Ambulatory Visit (INDEPENDENT_AMBULATORY_CARE_PROVIDER_SITE_OTHER): Payer: 59

## 2016-08-01 DIAGNOSIS — E785 Hyperlipidemia, unspecified: Secondary | ICD-10-CM

## 2016-08-01 DIAGNOSIS — R7309 Other abnormal glucose: Secondary | ICD-10-CM

## 2016-08-01 LAB — LIPID PANEL
Cholesterol: 238 mg/dL — ABNORMAL HIGH (ref 0–200)
HDL: 44.4 mg/dL (ref >39.00)
NONHDL: 193.96
TRIGLYCERIDES: 304 mg/dL — AB (ref 0.0–149.0)
Total CHOL/HDL Ratio: 5
VLDL: 60.8 mg/dL — ABNORMAL HIGH (ref 0.0–40.0)

## 2016-08-01 LAB — HEPATIC FUNCTION PANEL
ALK PHOS: 37 U/L — AB (ref 39–117)
ALT: 22 U/L (ref 0–53)
AST: 21 U/L (ref 0–37)
Albumin: 4 g/dL (ref 3.5–5.2)
BILIRUBIN DIRECT: 0.1 mg/dL (ref 0.0–0.3)
TOTAL PROTEIN: 6.8 g/dL (ref 6.0–8.3)
Total Bilirubin: 0.4 mg/dL (ref 0.2–1.2)

## 2016-08-01 LAB — BASIC METABOLIC PANEL
BUN: 15 mg/dL (ref 6–23)
CHLORIDE: 107 meq/L (ref 96–112)
CO2: 25 meq/L (ref 19–32)
Calcium: 9 mg/dL (ref 8.4–10.5)
Creatinine, Ser: 0.9 mg/dL (ref 0.40–1.50)
GFR: 91.83 mL/min (ref >60.00)
Glucose, Bld: 104 mg/dL — ABNORMAL HIGH (ref 70–99)
POTASSIUM: 3.7 meq/L (ref 3.5–5.1)
SODIUM: 140 meq/L (ref 135–145)

## 2016-08-01 LAB — LDL CHOLESTEROL, DIRECT: LDL DIRECT: 131 mg/dL

## 2016-08-12 ENCOUNTER — Ambulatory Visit (INDEPENDENT_AMBULATORY_CARE_PROVIDER_SITE_OTHER): Payer: 59 | Admitting: Family Medicine

## 2016-08-12 ENCOUNTER — Encounter: Payer: Self-pay | Admitting: Family Medicine

## 2016-08-12 VITALS — BP 150/90 | Temp 98.1°F | Wt 218.2 lb

## 2016-08-12 DIAGNOSIS — Z23 Encounter for immunization: Secondary | ICD-10-CM | POA: Diagnosis not present

## 2016-08-12 DIAGNOSIS — R739 Hyperglycemia, unspecified: Secondary | ICD-10-CM

## 2016-08-12 LAB — POCT GLYCOSYLATED HEMOGLOBIN (HGB A1C): HEMOGLOBIN A1C: 5.3

## 2016-08-12 MED ORDER — DONEPEZIL HCL 10 MG PO TABS: 10.0000 mg | ORAL_TABLET | Freq: Every day | ORAL | 3 refills | Status: DC

## 2016-08-12 MED ORDER — ATORVASTATIN CALCIUM 10 MG PO TABS: 10.0000 mg | ORAL_TABLET | Freq: Every day | ORAL | 3 refills | Status: DC

## 2016-08-12 MED ORDER — ATENOLOL 25 MG PO TABS: 12.5000 mg | ORAL_TABLET | Freq: Every day | ORAL | 3 refills | Status: DC

## 2016-08-12 MED ORDER — SILDENAFIL CITRATE 20 MG PO TABS: 20.0000 mg | ORAL_TABLET | Freq: Every evening | ORAL | 3 refills | Status: DC | PRN

## 2016-08-12 MED ORDER — FINASTERIDE 5 MG PO TABS: 5.0000 mg | ORAL_TABLET | Freq: Every day | ORAL | 3 refills | Status: DC

## 2016-08-12 NOTE — Patient Instructions (Signed)
Take aspirin, Tenormin one half tab, Lipitor 10 mg, Aricept 10 mg, Proscar 5 mg, all in the morning  Generic Viagra when necessary 2 hours prior to sex  Check your blood pressure daily in the morning  Return in 2 weeks for follow-up with all your blood pressure readings and the machine

## 2016-08-12 NOTE — Progress Notes (Signed)
Brent Hansen is a 60 year old married male nonsmoker who comes in today a copy by his wife for evaluation of hypertension diabetes and hyperlipidemia  His recent A1c today was 5.3%. Blood sugar week ago was 104 on diet and exercise  First hypertension takes Tenormin 12.5 mg daily along with an aspirin tablet. BP today 150/90. He thinks is been skipping some doses of medication  Recent lipids show total cholesterol to 238 LDL 141 goal below 75 he says he has not been taking his Lipitor.  Social history he does wife retire moved to [place] next year  Physical evaluation vital signs stable except for BP 150/90  Diabetes type 2 at goal,,,,,,,,,, continue current therapy  #2 hypertension question at goal........ continue current medication BP check daily follow-up in 2 weeks  Hyperlipidemia.........Marland Kitchen restart Lipitor 10 mg daily LDL goal below 75...Marland KitchenMarland KitchenMarland Kitchen follow-up lipid panel in 2 months

## 2016-08-12 NOTE — Progress Notes (Signed)
Pre visit review using our clinic review tool, if applicable. No additional management support is needed unless otherwise documented below in the visit note. 

## 2016-08-22 ENCOUNTER — Telehealth: Payer: Self-pay

## 2016-08-22 NOTE — Telephone Encounter (Signed)
Received PA request for Sildenafil Citrate. PA submitted & is pending. Key: Brent Hansen

## 2016-08-23 NOTE — Telephone Encounter (Signed)
PA denied because standard revatio policy does not allow coverage of revatio if the diagnosis is not pulmonary arterial hypertension (PAH).

## 2016-08-26 ENCOUNTER — Ambulatory Visit (INDEPENDENT_AMBULATORY_CARE_PROVIDER_SITE_OTHER): Payer: 59 | Admitting: Family Medicine

## 2016-08-26 ENCOUNTER — Encounter: Payer: Self-pay | Admitting: Family Medicine

## 2016-08-26 VITALS — BP 118/72 | HR 64 | Temp 98.0°F | Resp 16 | Ht 73.0 in | Wt 219.0 lb

## 2016-08-26 DIAGNOSIS — I10 Essential (primary) hypertension: Secondary | ICD-10-CM

## 2016-08-26 MED ORDER — LOSARTAN POTASSIUM 50 MG PO TABS: 50.0000 mg | ORAL_TABLET | Freq: Every day | ORAL | 3 refills | Status: DC

## 2016-08-26 NOTE — Patient Instructions (Signed)
Continue current medications  Start Hyzaar 50 mg........Marland Kitchen 1 daily in the morning  Check your blood pressure daily in the morning  Return in one month for follow-up with a record of all your blood pressure readings and the device

## 2016-08-26 NOTE — Progress Notes (Signed)
Brent Hansen is a 60 year old married male nonsmoker who comes in today for reevaluation of hypertension  He's on Tenormin 25 mg.......... dose one half tablet daily. BP at home and here are all elevated. BP here today by me 140/90 at home is get in the 160/100 180/115 all high pressures low-dose is 140/113.  Heart rate running around 60 therefore can increase the beta blocker  Physical examination vital signs stable he is afebrile except his blood pressure 140/90  Impression #1 hypertension not at goal......... at Hyzaar 50 mg daily........ BP check daily follow-up in one month

## 2016-08-29 NOTE — Telephone Encounter (Signed)
Will discuss with Dr. Sherren Mocha and see if there is an alternative medication that can be called in.

## 2016-09-02 NOTE — Telephone Encounter (Signed)
Dr Todd is aware 

## 2016-09-09 ENCOUNTER — Telehealth: Payer: Self-pay | Admitting: Family Medicine

## 2016-09-09 NOTE — Telephone Encounter (Signed)
noted 

## 2016-09-09 NOTE — Telephone Encounter (Signed)
[facility] - Client [place] Call Center  Patient Name: Brent Hansen  DOB: [birth year]    Initial Comment Caller states husband bp 189/136, he has a mild headace    Nurse Assessment  Nurse: Wayne Sever, RN, Tillie Rung Date/Time (Eastern Time): 09/09/2016 1:29:45 PM  Confirm and document reason for call. If symptomatic, describe symptoms. You must click the next button to save text entered. ---Caller states his BP is 189/136. He is on 2 medications for his BP and the BP is getting worse. He is having a light headache. Has not missed any medication dosages  Has the patient traveled out of the country within the last 30 days? ---Not Applicable  Does the patient have any new or worsening symptoms? ---Yes  Will a triage be completed? ---Yes  Related visit to physician within the last 2 weeks? ---Yes  Does the PT have any chronic conditions? (i.e. diabetes, asthma, etc.) ---Yes  List chronic conditions. ---HTN, High Cholesterol, Memory Loss  Is this a behavioral health or substance abuse call? ---No     Guidelines    Guideline Title Affirmed Question Affirmed Notes  High Blood Pressure BP ? 180/110    Final Disposition User   See Physician within 24 Hours [name], RN, Tillie Rung    Comments  Scheduled with Dr. Yong Channel at 1130 on 10/24   Referrals  REFERRED TO PCP OFFICE   Disagree/Comply: Comply

## 2016-09-09 NOTE — Telephone Encounter (Signed)
Appt scheduled with Dr. Yong Channel on 09/10/2016.

## 2016-09-10 ENCOUNTER — Ambulatory Visit (INDEPENDENT_AMBULATORY_CARE_PROVIDER_SITE_OTHER): Payer: 59 | Admitting: Family Medicine

## 2016-09-10 ENCOUNTER — Encounter: Payer: Self-pay | Admitting: Family Medicine

## 2016-09-10 VITALS — BP 180/80 | HR 61 | Temp 98.2°F | Wt 225.6 lb

## 2016-09-10 DIAGNOSIS — I10 Essential (primary) hypertension: Secondary | ICD-10-CM | POA: Diagnosis not present

## 2016-09-10 LAB — CBC
HCT: 43.9 % (ref 39.0–52.0)
Hemoglobin: 14.9 g/dL (ref 13.0–17.0)
MCHC: 33.9 g/dL (ref 30.0–36.0)
MCV: 88.5 fl (ref 78.0–100.0)
Platelets: 209 10*3/uL (ref 150.0–400.0)
RBC: 4.97 Mil/uL (ref 4.22–5.81)
RDW: 14 % (ref 11.5–15.5)
WBC: 9.4 10*3/uL (ref 4.0–10.5)

## 2016-09-10 LAB — COMPREHENSIVE METABOLIC PANEL
ALBUMIN: 4.2 g/dL (ref 3.5–5.2)
ALK PHOS: 41 U/L (ref 39–117)
ALT: 16 U/L (ref 0–53)
AST: 17 U/L (ref 0–37)
BUN: 15 mg/dL (ref 6–23)
CO2: 26 mEq/L (ref 19–32)
CREATININE: 0.89 mg/dL (ref 0.40–1.50)
Calcium: 9.5 mg/dL (ref 8.4–10.5)
Chloride: 105 mEq/L (ref 96–112)
GFR: 92.98 mL/min (ref >60.00)
GLUCOSE: 86 mg/dL (ref 70–99)
Potassium: 3.5 mEq/L (ref 3.5–5.1)
SODIUM: 139 meq/L (ref 135–145)
TOTAL PROTEIN: 7 g/dL (ref 6.0–8.3)
Total Bilirubin: 0.5 mg/dL (ref 0.2–1.2)

## 2016-09-10 MED ORDER — AMLODIPINE BESYLATE 10 MG PO TABS: 10.0000 mg | ORAL_TABLET | Freq: Every day | ORAL | 3 refills | Status: DC

## 2016-09-10 NOTE — Progress Notes (Signed)
Subjective:  Brent Hansen is a 60 y.o. year old very pleasant male patient who presents for/with See problem oriented charting ROS- No chest pain or shortness of breath. No blurry vision. No edema. Denies pain.see any ROS included in HPI as well.   Past Medical History-  Patient Active Problem List   Diagnosis Date Noted  . Memory disorder 09/27/2015    Priority: High  . Hyperlipidemia 12/26/2014    Priority: Medium  . BPH associated with nocturia 12/26/2014    Priority: Medium  . Sudden onset of severe headache 01/26/2014    Priority: Medium  . Vitiligo 12/20/2008    Priority: Medium  . 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 Essential hypertension 10/22/2007    Priority: Medium  . COLONIC POLYPS, HX OF 10/22/2007    Priority: Medium  . ALLERGIC RHINITIS, SEASONAL 02/06/2010    Priority: Low  . Testosterone deficiency 12/20/2008    Priority: Low  . Routine general medical examination at a health care facility 03/20/2016  . Abnormal finding on MRI of brain 11/07/2015  . 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 aspirin 81 MG tablet Take 81 mg by mouth daily. Reported on 03/20/2016    . atenolol (TENORMIN) 25 MG tablet Take 0.5 tablets (12.5 mg total) by mouth daily. 100 tablet 3  . atorvastatin (LIPITOR) 10 MG tablet Take 1 tablet (10 mg total) by mouth daily. 90 tablet 3  . donepezil (ARICEPT) 10 MG tablet Take 1 tablet (10 mg total) by mouth at bedtime. 100 tablet 3  . finasteride (PROSCAR) 5 MG tablet Take 1 tablet (5 mg total) by mouth daily. 100 tablet 3  . losartan (COZAAR) 50 MG tablet Take 1 tablet (50 mg total) by mouth daily. 90 tablet 3  . Phosphatidylserine-DHA-EPA (VAYACOG) 100-19.5-6.5 MG CAPS Take 1 capsule by mouth daily. 30 capsule 5  . sildenafil (REVATIO) 20 MG tablet Take 1 tablet (20 mg total) by mouth at bedtime as needed. 20 tablet 3  .  amLODipine (NORVASC) 10 MG tablet Take 1 tablet (10 mg total) by mouth daily. 30 tablet 3   No current facility-administered medications for this visit.     Objective: BP (!) 180/80 (BP Location: Right Arm, Patient Position: Sitting, Cuff Size: Large)   Pulse 61   Temp 98.2 F (36.8 C) (Oral)   Wt 225 lb 9.6 oz (102.3 kg)   SpO2 96%   BMI 29.76 kg/m  Gen: NAD, resting comfortably PERRLA CV: RRR no murmurs rubs or gallops Lungs: CTAB no crackles, wheeze, rhonchi Abdomen: soft/nontender/nondistended/normal bowel sounds.  Ext: no edema Skin: warm, dry  Assessment/Plan:  Essential hypertension S: controlled previously on atenolol 25mg  (half tablet), losartan 50mg  (started last visit with plan for follow up with Dr. Sherren Mocha on November 6th). Asymptomatic except for Mildest headache without good sleep and cannot sleep on left side after recent injury. Mild frequent urination but not worse than prior.  BP Readings from Last 3 Encounters:  09/10/16 (!) 180/80  08/26/16 118/72  08/12/16 (!) 150/90  A/P:Continue current meds:  Patient thinks BP higher since start of losartan at home. Checked bmet to rule out creatinine increase and likely renal artery stenosis- not present. Will add amlodipine 5mg  titrating to 10mg  and following up on Friday with me  3 days  Orders Placed This Encounter  Procedures  . CBC    [facility] [place]  . Comprehensive  metabolic panel    [place]    Meds ordered this encounter  Medications  . amLODipine (NORVASC) 10 MG tablet    Sig: Take 1 tablet (10 mg total) by mouth daily.    Dispense:  30 tablet    Refill:  3    Return precautions advised.  Garret Reddish, MD

## 2016-09-10 NOTE — Patient Instructions (Signed)
Start amlodipine 10mg  1/2 tablet tonight 1 tablet a night on Wednesday and Thursday night  Continue atenolol and losartan in AM  Would wait a few hours after BP medicines to check blood pressure  Before you leave- schedule follow up with me at 10 15 on Friday at check out  Worsening headache, blurry vision, chest pain, or shortness of breath- need to seek care immediately

## 2016-09-10 NOTE — Assessment & Plan Note (Signed)
S: controlled previously on atenolol 25mg  (half tablet), losartan 50mg  (started last visit with plan for follow up with Dr. Sherren Mocha on November 6th). Asymptomatic except for Mildest headache without good sleep and cannot sleep on left side after recent injury. Mild frequent urination but not worse than prior.  BP Readings from Last 3 Encounters:  09/10/16 (!) 180/80  08/26/16 118/72  08/12/16 (!) 150/90  A/P:Continue current meds:  Patient thinks BP higher since start of losartan at home. Checked bmet to rule out creatinine increase and likely renal artery stenosis- not present. Will add amlodipine 5mg  titrating to 10mg  and following up on Friday with me

## 2016-09-10 NOTE — Progress Notes (Signed)
Pre visit review using our clinic review tool, if applicable. No additional management support is needed unless otherwise documented below in the visit note. 

## 2016-09-13 ENCOUNTER — Ambulatory Visit (INDEPENDENT_AMBULATORY_CARE_PROVIDER_SITE_OTHER): Payer: 59 | Admitting: Family Medicine

## 2016-09-13 ENCOUNTER — Encounter: Payer: Self-pay | Admitting: Family Medicine

## 2016-09-13 DIAGNOSIS — I10 Essential (primary) hypertension: Secondary | ICD-10-CM

## 2016-09-13 NOTE — Progress Notes (Signed)
Pre visit review using our clinic review tool, if applicable. No additional management support is needed unless otherwise documented below in the visit note. 

## 2016-09-13 NOTE — Patient Instructions (Signed)
Continue current meds:  But will take 2 of Losartan 50mg  (total of 100mg ) until follow up as long as not lightheaded with this regimen or as long as BP not <110/65

## 2016-09-13 NOTE — Assessment & Plan Note (Addendum)
S: controlled poorly last visit on atenolol 25mg  (half tablet), losartan 50mg . Added amlodipine 5mg  titrating to 10mg  by this visit. Home readings still some in 150s up to 170s but much improved- poor control at home, reasonable in office BP Readings from Last 3 Encounters:  09/13/16 138/82  09/10/16 (!) 180/80  08/26/16 118/72  A/P:Continue current meds:  But will take 2 of his losartan to follow up as long as not lightheaded with this regimen or as long as BP not <110/65

## 2016-09-13 NOTE — Progress Notes (Signed)
Subjective:  Brent Hansen is a 60 y.o. year old very pleasant male patient who presents for/with See problem oriented charting ROS- No chest pain or shortness of breath. No headache or blurry vision.  .see any ROS included in HPI as well.   Past Medical History-  Patient Active Problem List   Diagnosis Date Noted  . Memory disorder 09/27/2015    Priority: High  . Hyperlipidemia 12/26/2014    Priority: Medium  . BPH associated with nocturia 12/26/2014    Priority: Medium  . Sudden onset of severe headache 01/26/2014    Priority: Medium  . Vitiligo 12/20/2008    Priority: Medium  . 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 Essential hypertension 10/22/2007    Priority: Medium  . COLONIC POLYPS, HX OF 10/22/2007    Priority: Medium  . ALLERGIC RHINITIS, SEASONAL 02/06/2010    Priority: Low  . Testosterone deficiency 12/20/2008    Priority: Low  . Routine general medical examination at a health care facility 03/20/2016  . Abnormal finding on MRI of brain 11/07/2015  . 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 amLODipine (NORVASC) 10 MG tablet Take 1 tablet (10 mg total) by mouth daily. 30 tablet 3  . aspirin 81 MG tablet Take 81 mg by mouth daily. Reported on 03/20/2016    . atenolol (TENORMIN) 25 MG tablet Take 0.5 tablets (12.5 mg total) by mouth daily. 100 tablet 3  . atorvastatin (LIPITOR) 10 MG tablet Take 1 tablet (10 mg total) by mouth daily. 90 tablet 3  . donepezil (ARICEPT) 10 MG tablet Take 1 tablet (10 mg total) by mouth at bedtime. 100 tablet 3  . finasteride (PROSCAR) 5 MG tablet Take 1 tablet (5 mg total) by mouth daily. 100 tablet 3  . losartan (COZAAR) 50 MG tablet Take 1 tablet (50 mg total) by mouth daily. 90 tablet 3  . Phosphatidylserine-DHA-EPA (VAYACOG) 100-19.5-6.5 MG CAPS Take 1 capsule by mouth daily. 30 capsule 5  . sildenafil (REVATIO) 20 MG  tablet Take 1 tablet (20 mg total) by mouth at bedtime as needed. 20 tablet 3   No current facility-administered medications for this visit.     Objective: BP 138/82 (BP Location: Right Arm, Patient Position: Sitting, Cuff Size: Large)   Pulse 63   Temp 98.2 F (36.8 C) (Oral)   Wt 225 lb (102.1 kg)   SpO2 96%   BMI 29.69 kg/m  Gen: NAD, resting comfortably CV: RRR no murmurs rubs or gallops Lungs: CTAB no crackles, wheeze, rhonchi Abdomen: soft/nontender/nondistended/normal bowel sounds. No rebound or guarding.  Ext: no edema, left arm in brace/splint Normal gait  Assessment/Plan:  Essential hypertension S: controlled poorly last visit on atenolol 25mg  (half tablet), losartan 50mg . Added amlodipine 5mg  titrating to 10mg  by this visit. Home readings still some in 150s up to 170s but much improved- poor control at home, reasonable in office BP Readings from Last 3 Encounters:  09/13/16 138/82  09/10/16 (!) 180/80  08/26/16 118/72  A/P:Continue current meds:  But will take 2 of his losartan to follow up as long as not lightheaded with this regimen or as long as BP not <110/65  Has follow up with Dr. Sherren Mocha in next few weeks  Return precautions advised.  Garret Reddish, MD

## 2016-09-23 ENCOUNTER — Ambulatory Visit: Payer: 59 | Admitting: Family Medicine

## 2016-09-24 ENCOUNTER — Encounter: Payer: Self-pay | Admitting: Family Medicine

## 2016-09-24 ENCOUNTER — Ambulatory Visit (INDEPENDENT_AMBULATORY_CARE_PROVIDER_SITE_OTHER): Payer: 59 | Admitting: Family Medicine

## 2016-09-24 VITALS — BP 152/88 | HR 67 | Temp 97.8°F | Wt 227.3 lb

## 2016-09-24 DIAGNOSIS — I10 Essential (primary) hypertension: Secondary | ICD-10-CM | POA: Diagnosis not present

## 2016-09-24 MED ORDER — HYDROCHLOROTHIAZIDE 12.5 MG PO CAPS: 12.5000 mg | ORAL_CAPSULE | Freq: Every day | ORAL | 4 refills | Status: DC

## 2016-09-24 MED ORDER — LOSARTAN POTASSIUM 100 MG PO TABS
100.0000 mg | ORAL_TABLET | Freq: Every day | ORAL | 3 refills | Status: DC
Start: 2016-09-24 — End: 2017-01-21

## 2016-09-24 NOTE — Patient Instructions (Signed)
HCTZ 12.5 mg......... one tablet daily in the morning  Avoid salt  Walk 30 minutes daily  Check your blood pressure daily in the morning and prior to bedtime  Take all your blood pressure medication in the morning  Flomax 0.4,,,,,,,,,,, one tablet after your evening meal  Follow-up in 4 weeks

## 2016-09-24 NOTE — Progress Notes (Signed)
Pre visit review using our clinic review tool, if applicable. No additional management support is needed unless otherwise documented below in the visit note. 

## 2016-09-24 NOTE — Progress Notes (Signed)
Time is a 60 year old married male nonsmoker who comes in today accompanied by his wife for evaluation of hypertension  He saw Dr. Yong Channel couple weeks ago because his blood pressure jumped in the 180 range. He started Norvasc 10 mg daily and increases the Cozaar to 100 mg daily. Blood pressures come down but still not normal trying 140/90 range. BP here today 152/88.  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 We'll switch to Flomax  Physical examination vital signs stable he is afebrile BP here right arm sitting position 152/88  trace peripheral edema  Impression hypertension not at goal........ at HCTZ 12.5 mg daily. Continue other medicines. BP check daily follow-up in 4 weeks

## 2016-09-25 ENCOUNTER — Telehealth: Payer: Self-pay | Admitting: Family Medicine

## 2016-09-25 NOTE — Telephone Encounter (Signed)
Pts wife went to the pharmacy and the Rx for Flomax was not sent in and would like to pick it up today.  Pharm: [facility] on [address]

## 2016-09-26 NOTE — Telephone Encounter (Signed)
Called and spoke with pt informing him prescription will be called into pharmacy upon Dr. Honor Junes return Monday.  After reviewing his chart note I see where he was started on Flomax but there are no instructions in AVS. Pt verbalized understanding.

## 2016-10-01 ENCOUNTER — Ambulatory Visit: Payer: 59 | Admitting: Neurology

## 2016-10-03 ENCOUNTER — Telehealth: Payer: Self-pay | Admitting: Neurology

## 2016-10-03 NOTE — Telephone Encounter (Signed)
Pt c/a appt on 12/14, he will be out of town. He said the only time he can come will be now thru 12/8, Dr W does not have any availability. Please call

## 2016-10-03 NOTE — Telephone Encounter (Signed)
Returned call and spoke to pt. Appt r/s for noon on Mon 12/4.

## 2016-10-21 ENCOUNTER — Ambulatory Visit (INDEPENDENT_AMBULATORY_CARE_PROVIDER_SITE_OTHER): Payer: 59 | Admitting: Neurology

## 2016-10-21 ENCOUNTER — Encounter: Payer: Self-pay | Admitting: Neurology

## 2016-10-21 VITALS — BP 119/80 | HR 61 | Ht 73.0 in | Wt 231.5 lb

## 2016-10-21 DIAGNOSIS — R413 Other amnesia: Secondary | ICD-10-CM | POA: Diagnosis not present

## 2016-10-21 MED ORDER — TRAZODONE HCL 50 MG PO TABS: 50.0000 mg | ORAL_TABLET | Freq: Every day | ORAL | 2 refills | Status: DC

## 2016-10-21 NOTE — Progress Notes (Signed)
Reason for visit: Memory disturbance  Brent Hansen is an 61 y.o. male  History of present illness:  Brent Hansen is a 60 year old right-handed [place] male with a history of a mild memory disturbance. Brent Hansen is on Aricept, tolerating Brent medication well. He indicates that he is having ongoing chronic issues with insomnia. He has a delay in sleep onset, he has to get up at least 2 times at night to use Brent bathroom and he may have difficulty getting back to sleep. Brent Hansen indicates that he will be retiring in Brent spring of 2018. He will be moving to [facility] [place]. He has not noted any significant change in his memory since last seen.  Past Medical History:  Diagnosis Date  . Cancer ([place])    testicular  . Hx of colonic polyps   . Hyperlipidemia   . Hypertension   . Memory disorder 09/27/2015  . Vitiligo     Past Surgical History:  Procedure Laterality Date  . BACK SURGERY  2017  . BICEPS TENDON REPAIR Right   . ELBOW FRACTURE SURGERY Left 2017  . ORCHIECTOMY     testicular cancer  . TONSILLECTOMY    . UMBILICAL HERNIA REPAIR      Family History  Problem Relation Age of Onset  . Diverticulitis Mother   . Diabetes Father   . Stroke Father   . Hypertension Father   . Diverticulitis Brother   . Diabetes Brother   . Heart attack      family hx  . Hypertension      family hx  . Hyperlipidemia      family hx  . Diabetes      family hx    Social history:  reports that he has never smoked. He has never used smokeless tobacco. He reports that he drinks alcohol. He reports that he does not use drugs.   No Known Allergies  Medications:  Prior to Admission medications   Medication Sig Start Date End Date Taking? Authorizing Provider  amLODipine (NORVASC) 10 MG tablet Take 1 tablet (10 mg total) by mouth daily. 09/10/16  Yes Marin Olp, MD  aspirin 81 MG tablet Take 81 mg by mouth daily. Reported on 03/20/2016   Yes Historical Provider, MD    atenolol (TENORMIN) 25 MG tablet Take 0.5 tablets (12.5 mg total) by mouth daily. 08/12/16  Yes Dorena Cookey, MD  atorvastatin (LIPITOR) 10 MG tablet Take 1 tablet (10 mg total) by mouth daily. 08/12/16  Yes Dorena Cookey, MD  donepezil (ARICEPT) 10 MG tablet Take 1 tablet (10 mg total) by mouth at bedtime. 08/12/16  Yes Dorena Cookey, MD  hydrochlorothiazide (MICROZIDE) 12.5 MG capsule Take 1 capsule (12.5 mg total) by mouth daily. 09/24/16  Yes Dorena Cookey, MD  losartan (COZAAR) 100 MG tablet Take 1 tablet (100 mg total) by mouth daily. 09/24/16  Yes Dorena Cookey, MD    ROS:  Out of a complete 14 system review of symptoms, Brent Hansen complains only of Brent following symptoms, and all other reviewed systems are negative.  Frequency of urination Frequent waking, snoring  Blood pressure 119/80, pulse 61, height 6\' 1"  (1.854 m), weight 231 lb 8 oz (105 kg).  Physical Exam  General: Brent Hansen is alert and cooperative at Brent time of Brent examination.  Skin: No significant peripheral edema is noted.   Neurologic Exam  Mental status: Brent Hansen is alert and oriented x 3  at Brent time of Brent examination. Brent Hansen has apparent normal recent and remote memory, with an apparently normal attention span and concentration ability. Mini-Mental Status Examination done today shows a total score of 30/30.   Cranial nerves: Facial symmetry is present. Speech is normal, no aphasia or dysarthria is noted. Extraocular movements are full. Visual fields are full.  Motor: Brent Hansen has good strength in all 4 extremities.  Sensory examination: Soft touch sensation is symmetric on Brent face, arms, and legs.  Coordination: Brent Hansen has good finger-nose-finger and heel-to-shin bilaterally.  Gait and station: Brent Hansen has a normal gait. Tandem gait is normal. Romberg is negative. No drift is seen.  Reflexes: Deep tendon reflexes are symmetric.   Assessment/Plan:  1. Reported memory  disturbance  2. Chronic insomnia  Brent Hansen will be given a trial on trazodone taking 50 mg at night. We may adjust Brent dose depending on what his needs are. He will continue Brent Aricept, he will follow-up in 6 months.  Jill Alexanders MD 10/21/2016 1:13 PM  [facility] [address] [place], [place] [address]  Phone [phone] Fax [phone]

## 2016-10-22 ENCOUNTER — Ambulatory Visit: Payer: 59 | Admitting: Family Medicine

## 2016-10-28 ENCOUNTER — Ambulatory Visit: Payer: 59 | Admitting: Family Medicine

## 2016-10-31 ENCOUNTER — Ambulatory Visit: Payer: 59 | Admitting: Neurology

## 2017-01-08 ENCOUNTER — Other Ambulatory Visit (INDEPENDENT_AMBULATORY_CARE_PROVIDER_SITE_OTHER): Payer: 59

## 2017-01-08 DIAGNOSIS — Z0000 Encounter for general adult medical examination without abnormal findings: Secondary | ICD-10-CM

## 2017-01-08 LAB — BASIC METABOLIC PANEL
BUN: 16 mg/dL (ref 6–23)
CO2: 29 meq/L (ref 19–32)
Calcium: 9.4 mg/dL (ref 8.4–10.5)
Chloride: 104 mEq/L (ref 96–112)
Creatinine, Ser: 1.04 mg/dL (ref 0.40–1.50)
GFR: 77.6 mL/min (ref >60.00)
GLUCOSE: 98 mg/dL (ref 70–99)
POTASSIUM: 3.7 meq/L (ref 3.5–5.1)
SODIUM: 140 meq/L (ref 135–145)

## 2017-01-08 LAB — HEPATIC FUNCTION PANEL
ALT: 14 U/L (ref 0–53)
AST: 16 U/L (ref 0–37)
Albumin: 4.2 g/dL (ref 3.5–5.2)
Alkaline Phosphatase: 36 U/L — ABNORMAL LOW (ref 39–117)
Bilirubin, Direct: 0.1 mg/dL (ref 0.0–0.3)
Total Bilirubin: 0.9 mg/dL (ref 0.2–1.2)
Total Protein: 6.6 g/dL (ref 6.0–8.3)

## 2017-01-08 LAB — POC URINALSYSI DIPSTICK (AUTOMATED)
BILIRUBIN UA: NEGATIVE
Blood, UA: NEGATIVE
GLUCOSE UA: NEGATIVE
Ketones, UA: NEGATIVE
LEUKOCYTES UA: NEGATIVE
NITRITE UA: NEGATIVE
Protein, UA: NEGATIVE
Spec Grav, UA: 1.025
Urobilinogen, UA: 0.2
pH, UA: 5.5

## 2017-01-08 LAB — CBC WITH DIFFERENTIAL/PLATELET
Basophils Absolute: 0.1 10*3/uL (ref 0.0–0.1)
Basophils Relative: 0.8 % (ref 0.0–3.0)
EOS PCT: 5.2 % — AB (ref 0.0–5.0)
Eosinophils Absolute: 0.3 10*3/uL (ref 0.0–0.7)
HEMATOCRIT: 41 % (ref 39.0–52.0)
Hemoglobin: 14.3 g/dL (ref 13.0–17.0)
LYMPHS ABS: 1.1 10*3/uL (ref 0.7–4.0)
LYMPHS PCT: 19.1 % (ref 12.0–46.0)
MCHC: 34.8 g/dL (ref 30.0–36.0)
MCV: 88.2 fl (ref 78.0–100.0)
MONOS PCT: 8.8 % (ref 3.0–12.0)
Monocytes Absolute: 0.5 10*3/uL (ref 0.1–1.0)
NEUTROS ABS: 4 10*3/uL (ref 1.4–7.7)
NEUTROS PCT: 66.1 % (ref 43.0–77.0)
PLATELETS: 188 10*3/uL (ref 150.0–400.0)
RBC: 4.64 Mil/uL (ref 4.22–5.81)
RDW: 13.8 % (ref 11.5–15.5)
WBC: 6 10*3/uL (ref 4.0–10.5)

## 2017-01-08 LAB — LIPID PANEL
CHOLESTEROL: 170 mg/dL (ref 0–200)
HDL: 38.1 mg/dL — ABNORMAL LOW (ref >39.00)
LDL CALC: 93 mg/dL (ref 0–99)
NonHDL: 131.89
TRIGLYCERIDES: 192 mg/dL — AB (ref 0.0–149.0)
Total CHOL/HDL Ratio: 4
VLDL: 38.4 mg/dL (ref 0.0–40.0)

## 2017-01-08 LAB — PSA: PSA: 0.9 ng/mL (ref 0.10–4.00)

## 2017-01-08 LAB — TSH: TSH: 1.96 u[IU]/mL (ref 0.35–4.50)

## 2017-01-12 ENCOUNTER — Other Ambulatory Visit: Payer: Self-pay | Admitting: Family Medicine

## 2017-01-13 ENCOUNTER — Encounter: Payer: 59 | Admitting: Family Medicine

## 2017-01-21 ENCOUNTER — Encounter: Payer: Self-pay | Admitting: Family Medicine

## 2017-01-21 ENCOUNTER — Ambulatory Visit (INDEPENDENT_AMBULATORY_CARE_PROVIDER_SITE_OTHER): Payer: 59 | Admitting: Family Medicine

## 2017-01-21 VITALS — BP 118/70 | Temp 97.8°F | Ht 72.5 in | Wt 227.0 lb

## 2017-01-21 DIAGNOSIS — Z0000 Encounter for general adult medical examination without abnormal findings: Secondary | ICD-10-CM | POA: Diagnosis not present

## 2017-01-21 DIAGNOSIS — E785 Hyperlipidemia, unspecified: Secondary | ICD-10-CM

## 2017-01-21 DIAGNOSIS — R351 Nocturia: Secondary | ICD-10-CM

## 2017-01-21 DIAGNOSIS — R413 Other amnesia: Secondary | ICD-10-CM

## 2017-01-21 DIAGNOSIS — N401 Enlarged prostate with lower urinary tract symptoms: Secondary | ICD-10-CM | POA: Diagnosis not present

## 2017-01-21 DIAGNOSIS — I10 Essential (primary) hypertension: Secondary | ICD-10-CM

## 2017-01-21 MED ORDER — DONEPEZIL HCL 10 MG PO TABS: 10.0000 mg | ORAL_TABLET | Freq: Every day | ORAL | 3 refills | Status: DC

## 2017-01-21 MED ORDER — AMLODIPINE BESYLATE 10 MG PO TABS: 10.0000 mg | ORAL_TABLET | Freq: Every day | ORAL | 3 refills | Status: AC

## 2017-01-21 MED ORDER — SILDENAFIL CITRATE 20 MG PO TABS: ORAL_TABLET | ORAL | 11 refills | Status: AC

## 2017-01-21 MED ORDER — DONEPEZIL HCL 10 MG PO TABS: 10.0000 mg | ORAL_TABLET | Freq: Every day | ORAL | 3 refills | Status: AC

## 2017-01-21 MED ORDER — ATENOLOL 25 MG PO TABS: 12.5000 mg | ORAL_TABLET | Freq: Every day | ORAL | 3 refills | Status: DC

## 2017-01-21 MED ORDER — LOSARTAN POTASSIUM 100 MG PO TABS: 100.0000 mg | ORAL_TABLET | Freq: Every day | ORAL | 3 refills | Status: AC

## 2017-01-21 MED ORDER — HYDROCHLOROTHIAZIDE 12.5 MG PO CAPS: 12.5000 mg | ORAL_CAPSULE | Freq: Every day | ORAL | 4 refills | Status: DC

## 2017-01-21 MED ORDER — ATORVASTATIN CALCIUM 10 MG PO TABS: 10.0000 mg | ORAL_TABLET | Freq: Every day | ORAL | 3 refills | Status: DC

## 2017-01-21 MED ORDER — TRAZODONE HCL 50 MG PO TABS: 50.0000 mg | ORAL_TABLET | Freq: Every day | ORAL | 3 refills | Status: AC

## 2017-01-21 NOTE — Progress Notes (Signed)
Brent Hansen is a 61 year old married male nonsmoker...Marland KitchenMarland KitchenMarland Kitchen due to retire at the end of this month and moved to [facility]..... Who comes in today for general physical examination because of a history of hypertension, hyperlipidemia, sleep dysfunction, and mild short-term memory loss  His blood pressure is 118/70 and Norvasc 10, atenolol 25 dose one half tab, Hydrocort thiazide 12.5 mg daily, Cozaar 100 mg daily.  He takes Lipitor and aspirin tablet daily for hyperlipidemia lipids are at goal  He takes trazodone 50 mg at bedtime because of a history of sleep dysfunction. He's now sleeping well.  He has a history short-term memory loss he's on Aricept 10 mg daily he feels like his memory in the past 12 months is not deteriorated.  Vaccinations up-to-date except he is doing Pneumovax  He gets routine eye care, dental care, colonoscopy recently normal.  14 point review of systems reviewed otherwise negative  No high risk features for screening hepatitis C  He has nocturia 3. He drinks a lot of caffeinated beverages. We tried various medications in the past that haven't helped.  BP 118/70 (BP Location: Left Arm, Patient Position: Sitting, Cuff Size: Large)   Temp 97.8 F (36.6 C) (Oral)   Ht 6' 0.5" (1.842 m)   Wt 227 lb (103 kg)   BMI 30.36 kg/m  Examination HEENT were negative neck was supple thyroid is not large no carotid bruits cardiopulmonary exam normal abdominal exam normal genitalia normal circumcised no rectal normal stool guaiac-negative prostate smooth 1+ nonnodular BPH. Extremities normal skin normal peripheral pulses normal except for chronic vitiligo  Neurologically he's intact however he does have some trouble recalling names dates and places. On physical examination found a scar on his neck. He said no he forgot to tell me had neck surgery for ruptured disc in 2017. Recovered to surgery no sequelae.  #1 hypertension at goal....... continue current therapy  #2  hyperlipidemia goal........ continue current therapy  Sleep dysfunction continue trazodone 50 mg at bedtime  #4 history of mild cognitive impairment...Marland KitchenMarland KitchenMarland Kitchen continue Aricept 10 mg daily  #5 vitiligo chronic  #6 status post cervical discectomy 2017.

## 2017-01-21 NOTE — Progress Notes (Signed)
Pre visit review using our clinic review tool, if applicable. No additional management support is needed unless otherwise documented below in the visit note. 

## 2017-01-21 NOTE — Patient Instructions (Signed)
Continue current medications  I would recommend [facility] as your new primary care physician  You  might want to decrease your  caffeine consumption to one cup a day...........Marland Kitchen It  would help decrease the nightly urination.  Good luck at the beach............Marland Kitchen platelets a golf and have fun and thanks for coming to see me all these years

## 2017-09-27 ENCOUNTER — Other Ambulatory Visit: Payer: Self-pay | Admitting: Family Medicine

## 2018-03-09 ENCOUNTER — Other Ambulatory Visit: Payer: Self-pay | Admitting: Family Medicine

## 2018-03-23 ENCOUNTER — Other Ambulatory Visit: Payer: Self-pay | Admitting: Family Medicine
